# Patient Record
Sex: Female | Born: 1985
Health system: Southern US, Community
[De-identification: ages and names within clinical notes are randomized; demographics above are authoritative.]

## PROBLEM LIST (undated history)

## (undated) DIAGNOSIS — D649 Anemia, unspecified: Secondary | ICD-10-CM

## (undated) DIAGNOSIS — Z789 Other specified health status: Secondary | ICD-10-CM

## (undated) HISTORY — DX: Anemia, unspecified: D64.9

## (undated) HISTORY — PX: NO PAST SURGERIES: SHX2092

## (undated) HISTORY — DX: Other specified health status: Z78.9

---

## 2011-08-29 DIAGNOSIS — L700 Acne vulgaris: Secondary | ICD-10-CM | POA: Insufficient documentation

## 2011-08-29 HISTORY — DX: Acne vulgaris: L70.0

## 2015-12-29 DIAGNOSIS — R630 Anorexia: Secondary | ICD-10-CM | POA: Insufficient documentation

## 2017-12-18 NOTE — Progress Notes (Addendum)
[facility] [address], [address] [place], [place] [address] [phone]  Date:  12/20/2017   Name:  Tammy Knapp   DOB:  October 07, 1985   MRN:  [id number]  PCP:  Pearline Cables, MD    Chief Complaint: Establish Care (Pt here to est care. )   History of Present Illness:  Tammy Knapp is a 32 y.o. very pleasant female patient who presents with the following:  Here today as a new patient to establish care Reviewed available records in EPIC Her husband is a [employer] employee in the IT dept so she has come to see [place] She had been with a [place] practice but has transferred to [place] her due to insurance change as above  She is married, she is from [place]. She has been in the [place] for 13 years She has a 49 yo daughter and stays home with her right now She graduated from [employer]; her degree is in biology In her free time she enjoys TV  She denies any major health issues in the past No operations NKDA  She does not take any medications  Trying to conceiveagain for a couple of months so far.   Her menses are regular   She had meant today to be a CPE and pap- can do for her today She notes that her last pap was about 3 years ago and that she has never had an abno  There are no active problems to display for this patient.   Past Medical History:  Diagnosis Date  . No pertinent past medical history     Past Surgical History:  Procedure Laterality Date  . NO PAST SURGERIES      Social History   Tobacco Use  . Smoking status: Not on file  Substance Use Topics  . Alcohol use: Not on file  . Drug use: Not on file    Family History  Problem Relation Age of Onset  . Healthy Mother   . Healthy Father     Allergies not on file  Medication list has been reviewed and updated.  Current Outpatient Medications on File Prior to Visit  Medication Sig Dispense Refill  . Prenatal Vit-Fe Fumarate-FA (PRENATAL PO) Take 1 tablet by mouth  as needed.     No current facility-administered medications on file prior to visit.     Review of Systems:  As per HPI- otherwise negative. No fever or chills No CP or SOB Her only concern is bad breath- she does visit DDS on a regular basis but is not flossing. Encouraged her to floss as well  Physical Examination: Vitals:   12/20/17 1005  BP: 110/74  Pulse: 86  Temp: 98 F (36.7 C)  SpO2: 96%   Vitals:   12/20/17 1005  Weight: 122 lb 12.8 oz (55.7 kg)  Height:  (1.626 m)   Body mass index is 21.08 kg/m. Ideal Body Weight: Weight in (lb) to have BMI = 25: 145.3  GEN: WDWN, NAD, Non-toxic, A & O x 3, petite build, looks well  HEENT: Atraumatic, Normocephalic. Neck supple. No masses, No LAD.  Bilateral TM wnl, oropharynx normal.  PEERL,EOMI.   Ears and Nose: No external deformity. CV: RRR, No M/G/R. No JVD. No thrill. No extra heart sounds. PULM: CTA B, no wheezes, crackles, rhonchi. No retractions. No resp. distress. No accessory muscle use. ABD: S, NT, ND, +BS. No rebound. No HSM. EXTR: No c/c/e NEURO  Normal gait.  PSYCH: Normally interactive. Conversant. Not depressed or anxious appearing.  Calm demeanor.  Pelvic: normal, no vaginal lesions or discharge. Uterus normal, no CMT, no adnexal tendereness or masses    Assessment and Plan: Physical exam  Screening for cervical cancer - Plan: Cytology - PAP  Screening for deficiency anemia - Plan: CBC  Screening for diabetes mellitus - Plan: Comprehensive metabolic panel, Hemoglobin A1c  Screening for hyperlipidemia - Plan: Lipid panel  Patient desires pregnancy - Plan: Ambulatory referral to Obstetrics / Gynecology  CPE as a new patient today Will plan further follow- up pending labs. Did go ahead and do OBG referral for her so she can establish care, she hopes to be pregnant soon   Signed Abbe Amsterdam, MD  Received blood work-  Message to pt  Results for orders placed or performed in visit on  12/20/17  CBC  Result Value Ref Range   WBC 5.9 4.0 - 10.5 K/uL   RBC 3.94 3.87 - 5.11 Mil/uL   Platelets 241.0 150.0 - 400.0 K/uL   Hemoglobin 12.5 12.0 - 15.0 g/dL   HCT 16.1 09.6 - 04.5 %   MCV 94.0 78.0 - 100.0 fl   MCHC 33.6 30.0 - 36.0 g/dL   RDW 40.9 81.1 - 91.4 %  Comprehensive metabolic panel  Result Value Ref Range   Sodium 137 135 - 145 mEq/L   Potassium 3.8 3.5 - 5.1 mEq/L   Chloride 105 96 - 112 mEq/L   CO2 26 19 - 32 mEq/L   Glucose, Bld 91 70 - 99 mg/dL   BUN 16 6 - 23 mg/dL   Creatinine, Ser 7.82 0.40 - 1.20 mg/dL   Total Bilirubin 0.4 0.2 - 1.2 mg/dL   Alkaline Phosphatase 51 39 - 117 U/L   AST 12 0 - 37 U/L   ALT 8 0 - 35 U/L   Total Protein 7.6 6.0 - 8.3 g/dL   Albumin 4.3 3.5 - 5.2 g/dL   Calcium 9.5 8.4 - 95.6 mg/dL   GFR 213.08 >65.78 mL/min  Hemoglobin A1c  Result Value Ref Range   Hgb A1c MFr Bld 5.2 4.6 - 6.5 %  Lipid panel  Result Value Ref Range   Cholesterol 163 0 - 200 mg/dL   Triglycerides 46.9 0.0 - 149.0 mg/dL   HDL 62.95 >28.41 mg/dL   VLDL 32.4 0.0 - 40.1 mg/dL   LDL Cholesterol 98 0 - 99 mg/dL   Total CHOL/HDL Ratio 3    NonHDL 115.47   Cytology - PAP  Result Value Ref Range   Adequacy      Satisfactory for evaluation  endocervical/transformation zone component PRESENT.   Diagnosis      NEGATIVE FOR INTRAEPITHELIAL LESIONS OR MALIGNANCY.   HPV NOT DETECTED    Material Submitted CervicoVaginal Pap [ThinPrep Imaged]    received pap 5/4- message to pt

## 2017-12-20 ENCOUNTER — Ambulatory Visit (INDEPENDENT_AMBULATORY_CARE_PROVIDER_SITE_OTHER): Payer: No Typology Code available for payment source | Admitting: Family Medicine

## 2017-12-20 ENCOUNTER — Other Ambulatory Visit (HOSPITAL_COMMUNITY)
Admission: RE | Admit: 2017-12-20 | Discharge: 2017-12-20 | Disposition: A | Discharge status: Home / Self Care | Payer: No Typology Code available for payment source | Source: Ambulatory Visit | Attending: Family Medicine | Admitting: Family Medicine

## 2017-12-20 ENCOUNTER — Encounter: Payer: Self-pay | Admitting: Family Medicine

## 2017-12-20 VITALS — BP 110/74 | HR 86 | Temp 98.0°F | Ht 64.0 in | Wt 122.8 lb

## 2017-12-20 DIAGNOSIS — Z130 Encounter for screening for diseases of the blood and blood-forming organs and certain disorders involving the immune mechanism: Secondary | ICD-10-CM | POA: Diagnosis not present

## 2017-12-20 DIAGNOSIS — Z124 Encounter for screening for malignant neoplasm of cervix: Secondary | ICD-10-CM | POA: Diagnosis not present

## 2017-12-20 DIAGNOSIS — Z13220 Encounter for screening for lipoid disorders: Secondary | ICD-10-CM | POA: Diagnosis not present

## 2017-12-20 DIAGNOSIS — Z319 Encounter for procreative management, unspecified: Secondary | ICD-10-CM

## 2017-12-20 DIAGNOSIS — Z0000 Encounter for general adult medical examination without abnormal findings: Secondary | ICD-10-CM

## 2017-12-20 DIAGNOSIS — Z1151 Encounter for screening for human papillomavirus (HPV): Secondary | ICD-10-CM | POA: Insufficient documentation

## 2017-12-20 DIAGNOSIS — Z131 Encounter for screening for diabetes mellitus: Secondary | ICD-10-CM

## 2017-12-20 LAB — CBC
HCT: 37.1 % (ref 36.0–46.0)
HEMOGLOBIN: 12.5 g/dL (ref 12.0–15.0)
MCHC: 33.6 g/dL (ref 30.0–36.0)
MCV: 94 fl (ref 78.0–100.0)
PLATELETS: 241 10*3/uL (ref 150.0–400.0)
RBC: 3.94 Mil/uL (ref 3.87–5.11)
RDW: 12.9 % (ref 11.5–15.5)
WBC: 5.9 10*3/uL (ref 4.0–10.5)

## 2017-12-20 LAB — HEMOGLOBIN A1C: Hgb A1c MFr Bld: 5.2 % (ref 4.6–6.5)

## 2017-12-20 LAB — COMPREHENSIVE METABOLIC PANEL
ALT: 8 U/L (ref 0–35)
AST: 12 U/L (ref 0–37)
Albumin: 4.3 g/dL (ref 3.5–5.2)
Alkaline Phosphatase: 51 U/L (ref 39–117)
BILIRUBIN TOTAL: 0.4 mg/dL (ref 0.2–1.2)
BUN: 16 mg/dL (ref 6–23)
CALCIUM: 9.5 mg/dL (ref 8.4–10.5)
CO2: 26 mEq/L (ref 19–32)
CREATININE: 0.67 mg/dL (ref 0.40–1.20)
Chloride: 105 mEq/L (ref 96–112)
GFR: 107.69 mL/min (ref >60.00)
Glucose, Bld: 91 mg/dL (ref 70–99)
Potassium: 3.8 mEq/L (ref 3.5–5.1)
Sodium: 137 mEq/L (ref 135–145)
TOTAL PROTEIN: 7.6 g/dL (ref 6.0–8.3)

## 2017-12-20 LAB — LIPID PANEL
CHOL/HDL RATIO: 3
CHOLESTEROL: 163 mg/dL (ref 0–200)
HDL: 47.6 mg/dL (ref >39.00)
LDL CALC: 98 mg/dL (ref 0–99)
NonHDL: 115.47
TRIGLYCERIDES: 87 mg/dL (ref 0.0–149.0)
VLDL: 17.4 mg/dL (ref 0.0–40.0)

## 2017-12-20 NOTE — Patient Instructions (Signed)
It was very nice to meet you today- take care and I will be in touch with your labs and pap asap We will also get you set up with an OB/GYN doctor who can help you once you become pregnant. Also do take your pre-natal vitamin daily starting now  Health Maintenance, Female Adopting a healthy lifestyle and getting preventive care can go a long way to promote health and wellness. Talk with your health care provider about what schedule of regular examinations is right for you. This is a good chance for you to check in with your provider about disease prevention and staying healthy. In between checkups, there are plenty of things you can do on your own. Experts have done a lot of research about which lifestyle changes and preventive measures are most likely to keep you healthy. Ask your health care provider for more information. Weight and diet Eat a healthy diet  Be sure to include plenty of vegetables, fruits, low-fat dairy products, and lean protein.  Do not eat a lot of foods high in solid fats, added sugars, or salt.  Get regular exercise. This is one of the most important things you can do for your health. ? Most adults should exercise for at least 150 minutes each week. The exercise should increase your heart rate and make you sweat (moderate-intensity exercise). ? Most adults should also do strengthening exercises at least twice a week. This is in addition to the moderate-intensity exercise.  Maintain a healthy weight  Body mass index (BMI) is a measurement that can be used to identify possible weight problems. It estimates body fat based on height and weight. Your health care provider can help determine your BMI and help you achieve or maintain a healthy weight.  For females 59 years of age and older: ? A BMI below 18.5 is considered underweight. ? A BMI of 18.5 to 24.9 is normal. ? A BMI of 25 to 29.9 is considered overweight. ? A BMI of 30 and above is considered obese.  Watch  levels of cholesterol and blood lipids  You should start having your blood tested for lipids and cholesterol at 33 years of age, then have this test every 5 years.  You may need to have your cholesterol levels checked more often if: ? Your lipid or cholesterol levels are high. ? You are older than 33 years of age. ? You are at high risk for heart disease.  Cancer screening Lung Cancer  Lung cancer screening is recommended for adults 66-55 years old who are at high risk for lung cancer because of a history of smoking.  A yearly low-dose CT scan of the lungs is recommended for people who: ? Currently smoke. ? Have quit within the past 15 years. ? Have at least a 30-pack-year history of smoking. A pack year is smoking an average of one pack of cigarettes a day for 1 year.  Yearly screening should continue until it has been 15 years since you quit.  Yearly screening should stop if you develop a health problem that would prevent you from having lung cancer treatment.  Breast Cancer  Practice breast self-awareness. This means understanding how your breasts normally appear and feel.  It also means doing regular breast self-exams. Let your health care provider know about any changes, no matter how small.  If you are in your 20s or 30s, you should have a clinical breast exam (CBE) by a health care provider every 1-3 years as part of  a regular health exam.  If you are 63 or older, have a CBE every year. Also consider having a breast X-ray (mammogram) every year.  If you have a family history of breast cancer, talk to your health care provider about genetic screening.  If you are at high risk for breast cancer, talk to your health care provider about having an MRI and a mammogram every year.  Breast cancer gene (BRCA) assessment is recommended for women who have family members with BRCA-related cancers. BRCA-related cancers include: ? Breast. ? Ovarian. ? Tubal. ? Peritoneal  cancers.  Results of the assessment will determine the need for genetic counseling and BRCA1 and BRCA2 testing.  Cervical Cancer Your health care provider may recommend that you be screened regularly for cancer of the pelvic organs (ovaries, uterus, and vagina). This screening involves a pelvic examination, including checking for microscopic changes to the surface of your cervix (Pap test). You may be encouraged to have this screening done every 3 years, beginning at age 77.  For women ages 78-65, health care providers may recommend pelvic exams and Pap testing every 3 years, or they may recommend the Pap and pelvic exam, combined with testing for human papilloma virus (HPV), every 5 years. Some types of HPV increase your risk of cervical cancer. Testing for HPV may also be done on women of any age with unclear Pap test results.  Other health care providers may not recommend any screening for nonpregnant women who are considered low risk for pelvic cancer and who do not have symptoms. Ask your health care provider if a screening pelvic exam is right for you.  If you have had past treatment for cervical cancer or a condition that could lead to cancer, you need Pap tests and screening for cancer for at least 20 years after your treatment. If Pap tests have been discontinued, your risk factors (such as having a new sexual partner) need to be reassessed to determine if screening should resume. Some women have medical problems that increase the chance of getting cervical cancer. In these cases, your health care provider may recommend more frequent screening and Pap tests.  Colorectal Cancer  This type of cancer can be detected and often prevented.  Routine colorectal cancer screening usually begins at 33 years of age and continues through 33 years of age.  Your health care provider may recommend screening at an earlier age if you have risk factors for colon cancer.  Your health care provider may also  recommend using home test kits to check for hidden blood in the stool.  A small camera at the end of a tube can be used to examine your colon directly (sigmoidoscopy or colonoscopy). This is done to check for the earliest forms of colorectal cancer.  Routine screening usually begins at age 17.  Direct examination of the colon should be repeated every 5-10 years through 33 years of age. However, you may need to be screened more often if early forms of precancerous polyps or small growths are found.  Skin Cancer  Check your skin from head to toe regularly.  Tell your health care provider about any new moles or changes in moles, especially if there is a change in a mole's shape or color.  Also tell your health care provider if you have a mole that is larger than the size of a pencil eraser.  Always use sunscreen. Apply sunscreen liberally and repeatedly throughout the day.  Protect yourself by wearing long sleeves,  pants, a wide-brimmed hat, and sunglasses whenever you are outside.  Heart disease, diabetes, and high blood pressure  High blood pressure causes heart disease and increases the risk of stroke. High blood pressure is more likely to develop in: ? People who have blood pressure in the high end of the normal range (130-139/85-89 mm Hg). ? People who are overweight or obese. ? People who are African American.  If you are 58-19 years of age, have your blood pressure checked every 3-5 years. If you are 41 years of age or older, have your blood pressure checked every year. You should have your blood pressure measured twice-once when you are at a hospital or clinic, and once when you are not at a hospital or clinic. Record the average of the two measurements. To check your blood pressure when you are not at a hospital or clinic, you can use: ? An automated blood pressure machine at a pharmacy. ? A home blood pressure monitor.  If you are between 4 years and 74 years old, ask your  health care provider if you should take aspirin to prevent strokes.  Have regular diabetes screenings. This involves taking a blood sample to check your fasting blood sugar level. ? If you are at a normal weight and have a low risk for diabetes, have this test once every three years after 33 years of age. ? If you are overweight and have a high risk for diabetes, consider being tested at a younger age or more often. Preventing infection Hepatitis B  If you have a higher risk for hepatitis B, you should be screened for this virus. You are considered at high risk for hepatitis B if: ? You were born in a country where hepatitis B is common. Ask your health care provider which countries are considered high risk. ? Your parents were born in a high-risk country, and you have not been immunized against hepatitis B (hepatitis B vaccine). ? You have HIV or AIDS. ? You use needles to inject street drugs. ? You live with someone who has hepatitis B. ? You have had sex with someone who has hepatitis B. ? You get hemodialysis treatment. ? You take certain medicines for conditions, including cancer, organ transplantation, and autoimmune conditions.  Hepatitis C  Blood testing is recommended for: ? Everyone born from [birth year] through 1966. ? Anyone with known risk factors for hepatitis C.  Sexually transmitted infections (STIs)  You should be screened for sexually transmitted infections (STIs) including gonorrhea and chlamydia if: ? You are sexually active and are younger than 33 years of age. ? You are older than 33 years of age and your health care provider tells you that you are at risk for this type of infection. ? Your sexual activity has changed since you were last screened and you are at an increased risk for chlamydia or gonorrhea. Ask your health care provider if you are at risk.  If you do not have HIV, but are at risk, it may be recommended that you take a prescription medicine daily to  prevent HIV infection. This is called pre-exposure prophylaxis (PrEP). You are considered at risk if: ? You are sexually active and do not regularly use condoms or know the HIV status of your partner(s). ? You take drugs by injection. ? You are sexually active with a partner who has HIV.  Talk with your health care provider about whether you are at high risk of being infected with HIV. If you  choose to begin PrEP, you should first be tested for HIV. You should then be tested every 3 months for as long as you are taking PrEP. Pregnancy  If you are premenopausal and you may become pregnant, ask your health care provider about preconception counseling.  If you may become pregnant, take 400 to 800 micrograms (mcg) of folic acid every day.  If you want to prevent pregnancy, talk to your health care provider about birth control (contraception). Osteoporosis and menopause  Osteoporosis is a disease in which the bones lose minerals and strength with aging. This can result in serious bone fractures. Your risk for osteoporosis can be identified using a bone density scan.  If you are 11 years of age or older, or if you are at risk for osteoporosis and fractures, ask your health care provider if you should be screened.  Ask your health care provider whether you should take a calcium or vitamin D supplement to lower your risk for osteoporosis.  Menopause may have certain physical symptoms and risks.  Hormone replacement therapy may reduce some of these symptoms and risks. Talk to your health care provider about whether hormone replacement therapy is right for you. Follow these instructions at home:  Schedule regular health, dental, and eye exams.  Stay current with your immunizations.  Do not use any tobacco products including cigarettes, chewing tobacco, or electronic cigarettes.  If you are pregnant, do not drink alcohol.  If you are breastfeeding, limit how much and how often you drink  alcohol.  Limit alcohol intake to no more than 1 drink per day for nonpregnant women. One drink equals 12 ounces of beer, 5 ounces of wine, or 1 ounces of hard liquor.  Do not use street drugs.  Do not share needles.  Ask your health care provider for help if you need support or information about quitting drugs.  Tell your health care provider if you often feel depressed.  Tell your health care provider if you have ever been abused or do not feel safe at home. This information is not intended to replace advice given to you by your health care provider. Make sure you discuss any questions you have with your health care provider. Document Released: 02/20/2011 Document Revised: 01/13/2016 Document Reviewed: 05/11/2015 Elsevier Interactive Patient Education  [employer].

## 2017-12-21 LAB — CYTOLOGY - PAP
DIAGNOSIS: NEGATIVE
HPV: NOT DETECTED

## 2017-12-22 ENCOUNTER — Encounter: Payer: Self-pay | Admitting: Family Medicine

## 2018-01-15 NOTE — Progress Notes (Signed)
[facility] [address], [address] [place], [place] [address] [phone]  Date:  01/17/2018   Name:  Tammy Knapp   DOB:  1986/06/30   MRN:  [id number]  PCP:  Tammy Cables, MD    Chief Complaint: Nausea (headache, nausea and vomiting in morning, fatigue, one month) and Emesis   History of Present Illness:  Tammy Knapp is a 32 y.o. very pleasant female patient who presents with the following:  Here today with concern of vomiting  I had seen here earlier this month for a CPE: She is married, she is from [place]. She has been in the [place] for 13 years She has a 42 yo daughter and stays home with her right now She graduated from [employer]; her degree is in biology In her free time she enjoys TV  She denies any major health issues in the past No operations NKDA  She does not take any medications  Trying to conceiveagain for a couple of months so far.   Her menses are regular   She had meant today to be a CPE and pap- can do for her today She notes that her last pap was about 3 years ago and that she has never had an abnormal  Pt has noted that she felt fatigued the last month or so, and she has had some headaches as well  Her HA is not severe however Also the last couple of weeks she had had some vomiting and nausea, mostly in the am   Her LMP was 11/30/17 No vaginal bleeding since No belly pain or cramping She has not taken a home OCP as of yet   There are no active problems to display for this patient.   Past Medical History:  Diagnosis Date  . No pertinent past medical history     Past Surgical History:  Procedure Laterality Date  . NO PAST SURGERIES      Social History   Tobacco Use  . Smoking status: Never Smoker  . Smokeless tobacco: Never Used  Substance Use Topics  . Alcohol use: Not on file  . Drug use: Not on file    Family History  Problem Relation Age of Onset  . Healthy Mother   .  Healthy Father     Not on File  Medication list has been reviewed and updated.  Current Outpatient Medications on File Prior to Visit  Medication Sig Dispense Refill  . Prenatal Vit-Fe Fumarate-FA (PRENATAL PO) Take 1 tablet by mouth as needed.     No current facility-administered medications on file prior to visit.     Review of Systems:  As per HPI- otherwise negative. No fever or chills No CP or SOB No belly pain    Physical Examination: Vitals:   01/17/18 1008  BP: 108/68  Pulse: 68  Resp: 16  SpO2: 98%   Vitals:   01/17/18 1008  Weight: 122 lb 9.6 oz (55.6 kg)  Height:  (1.626 m)   Body mass index is 21.04 kg/m. Ideal Body Weight: Weight in (lb) to have BMI = 25: 145.3  GEN: WDWN, NAD, Non-toxic, A & O x 3, looks well, slim build. Here with her adorable 81 yo daughter today HEENT: Atraumatic, Normocephalic. Neck supple. No masses, No LAD. Ears and Nose: No external deformity. CV: RRR, No M/G/R. No JVD. No thrill. No extra heart sounds. PULM: CTA B, no wheezes, crackles, rhonchi. No retractions.  No resp. distress. No accessory muscle use. ABD: S, NT, ND. No rebound. No HSM.  Benign belly  EXTR: No c/c/e NEURO Normal gait.  PSYCH: Normally interactive. Conversant. Not depressed or anxious appearing.  Calm demeanor.   Results for orders placed or performed in visit on 01/17/18  POCT urine pregnancy  Result Value Ref Range   Preg Test, Ur Positive (A) Negative    Assessment and Plan: Less than [redacted] weeks gestation of pregnancy - Plan: Ambulatory referral to Obstetrics / Gynecology  Morning sickness - Plan: POCT urine pregnancy  Early pregnancy Calculated her ECD in January based on LMP  Will set her up with OBG care right away She is asked to continue her pre-natal vitamins Given information about managing nausea and also a sheet regarding OTC meds in pregnancy per her request   Signed Abbe Amsterdam, MD

## 2018-01-17 ENCOUNTER — Encounter: Payer: Self-pay | Admitting: Family Medicine

## 2018-01-17 ENCOUNTER — Ambulatory Visit (INDEPENDENT_AMBULATORY_CARE_PROVIDER_SITE_OTHER): Payer: No Typology Code available for payment source | Admitting: Family Medicine

## 2018-01-17 VITALS — BP 108/68 | HR 68 | Resp 16 | Ht 64.0 in | Wt 122.6 lb

## 2018-01-17 DIAGNOSIS — Z3A01 Less than 8 weeks gestation of pregnancy: Secondary | ICD-10-CM

## 2018-01-17 DIAGNOSIS — O210 Mild hyperemesis gravidarum: Secondary | ICD-10-CM | POA: Diagnosis not present

## 2018-01-17 LAB — POCT URINE PREGNANCY: PREG TEST UR: POSITIVE — AB

## 2018-01-17 NOTE — Patient Instructions (Signed)
You are pregnant- congratulations!   By your last period you are almost 7 weeks today, and your estimated due date is 09/06/2018 I will refer you to OB here at the [facility] to care for you during your pregnancy Please continue your prenatal vitamins If you have any concerning symptoms such as abdominal pain or bleeding, please seek care right away

## 2018-02-12 ENCOUNTER — Ambulatory Visit (INDEPENDENT_AMBULATORY_CARE_PROVIDER_SITE_OTHER): Payer: No Typology Code available for payment source | Admitting: Advanced Practice Midwife

## 2018-02-12 ENCOUNTER — Encounter: Payer: Self-pay | Admitting: Advanced Practice Midwife

## 2018-02-12 ENCOUNTER — Encounter: Payer: No Typology Code available for payment source | Admitting: Advanced Practice Midwife

## 2018-02-12 VITALS — BP 113/75 | HR 85 | Wt 127.0 lb

## 2018-02-12 DIAGNOSIS — Z113 Encounter for screening for infections with a predominantly sexual mode of transmission: Secondary | ICD-10-CM

## 2018-02-12 DIAGNOSIS — Z3481 Encounter for supervision of other normal pregnancy, first trimester: Secondary | ICD-10-CM

## 2018-02-12 DIAGNOSIS — Z3490 Encounter for supervision of normal pregnancy, unspecified, unspecified trimester: Secondary | ICD-10-CM | POA: Insufficient documentation

## 2018-02-12 NOTE — Patient Instructions (Addendum)
 First Trimester of Pregnancy The first trimester of pregnancy is from week 1 until the end of week 13 (months 1 through 3). A week after a sperm fertilizes an egg, the egg will implant on the wall of the uterus. This embryo will begin to develop into a baby. Genes from you and your partner will form the baby. The female genes will determine whether the baby will be a boy or a girl. At 6-8 weeks, the eyes and face will be formed, and the heartbeat can be seen on ultrasound. At the end of 12 weeks, all the baby's organs will be formed. Now that you are pregnant, you will want to do everything you can to have a healthy baby. Two of the most important things are to get good prenatal care and to follow your health care provider's instructions. Prenatal care is all the medical care you receive before the baby's birth. This care will help prevent, find, and treat any problems during the pregnancy and childbirth. Body changes during your first trimester Your body goes through many changes during pregnancy. The changes vary from woman to woman.  You may gain or lose a couple of pounds at first.  You may feel sick to your stomach (nauseous) and you may throw up (vomit). If the vomiting is uncontrollable, call your health care provider.  You may tire easily.  You may develop headaches that can be relieved by medicines. All medicines should be approved by your health care provider.  You may urinate more often. Painful urination may mean you have a bladder infection.  You may develop heartburn as a result of your pregnancy.  You may develop constipation because certain hormones are causing the muscles that push stool through your intestines to slow down.  You may develop hemorrhoids or swollen veins (varicose veins).  Your breasts may begin to grow larger and become tender. Your nipples may stick out more, and the tissue that surrounds them (areola) may become darker.  Your gums may bleed and may be  sensitive to brushing and flossing.  Dark spots or blotches (chloasma, mask of pregnancy) may develop on your face. This will likely fade after the baby is born.  Your menstrual periods will stop.  You may have a loss of appetite.  You may develop cravings for certain kinds of food.  You may have changes in your emotions from day to day, such as being excited to be pregnant or being concerned that something may go wrong with the pregnancy and baby.  You may have more vivid and strange dreams.  You may have changes in your hair. These can include thickening of your hair, rapid growth, and changes in texture. Some women also have hair loss during or after pregnancy, or hair that feels dry or thin. Your hair will most likely return to normal after your baby is born.  What to expect at prenatal visits During a routine prenatal visit:  You will be weighed to make sure you and the baby are growing normally.  Your blood pressure will be taken.  Your abdomen will be measured to track your baby's growth.  The fetal heartbeat will be listened to between weeks 10 and 14 of your pregnancy.  Test results from any previous visits will be discussed.  Your health care provider may ask you:  How you are feeling.  If you are feeling the baby move.  If you have had any abnormal symptoms, such as leaking fluid, bleeding, severe   headaches, or abdominal cramping.  If you are using any tobacco products, including cigarettes, chewing tobacco, and electronic cigarettes.  If you have any questions.  Other tests that may be performed during your first trimester include:  Blood tests to find your blood type and to check for the presence of any previous infections. The tests will also be used to check for low iron levels (anemia) and protein on red blood cells (Rh antibodies). Depending on your risk factors, or if you previously had diabetes during pregnancy, you may have tests to check for high blood  sugar that affects pregnant women (gestational diabetes).  Urine tests to check for infections, diabetes, or protein in the urine.  An ultrasound to confirm the proper growth and development of the baby.  Fetal screens for spinal cord problems (spina bifida) and Down syndrome.  HIV (human immunodeficiency virus) testing. Routine prenatal testing includes screening for HIV, unless you choose not to have this test.  You may need other tests to make sure you and the baby are doing well.  Follow these instructions at home: Medicines  Follow your health care provider's instructions regarding medicine use. Specific medicines may be either safe or unsafe to take during pregnancy.  Take a prenatal vitamin that contains at least 600 micrograms (mcg) of folic acid.  If you develop constipation, try taking a stool softener if your health care provider approves. Eating and drinking  Eat a balanced diet that includes fresh fruits and vegetables, whole grains, good sources of protein such as meat, eggs, or tofu, and low-fat dairy. Your health care provider will help you determine the amount of weight gain that is right for you.  Avoid raw meat and uncooked cheese. These carry germs that can cause birth defects in the baby.  Eating four or five small meals rather than three large meals a day may help relieve nausea and vomiting. If you start to feel nauseous, eating a few soda crackers can be helpful. Drinking liquids between meals, instead of during meals, also seems to help ease nausea and vomiting.  Limit foods that are high in fat and processed sugars, such as fried and sweet foods.  To prevent constipation: ? Eat foods that are high in fiber, such as fresh fruits and vegetables, whole grains, and beans. ? Drink enough fluid to keep your urine clear or pale yellow. Activity  Exercise only as directed by your health care provider. Most women can continue their usual exercise routine during  pregnancy. Try to exercise for 30 minutes at least 5 days a week. Exercising will help you: ? Control your weight. ? Stay in shape. ? Be prepared for labor and delivery.  Experiencing pain or cramping in the lower abdomen or lower back is a good sign that you should stop exercising. Check with your health care provider before continuing with normal exercises.  Try to avoid standing for long periods of time. Move your legs often if you must stand in one place for a long time.  Avoid heavy lifting.  Wear low-heeled shoes and practice good posture.  You may continue to have sex unless your health care provider tells you not to. Relieving pain and discomfort  Wear a good support bra to relieve breast tenderness.  Take warm sitz baths to soothe any pain or discomfort caused by hemorrhoids. Use hemorrhoid cream if your health care provider approves.  Rest with your legs elevated if you have leg cramps or low back pain.  If you   develop varicose veins in your legs, wear support hose. Elevate your feet for 15 minutes, 3-4 times a day. Limit salt in your diet. Prenatal care  Schedule your prenatal visits by the twelfth week of pregnancy. They are usually scheduled monthly at first, then more often in the last 2 months before delivery.  Write down your questions. Take them to your prenatal visits.  Keep all your prenatal visits as told by your health care provider. This is important. Safety  Wear your seat belt at all times when driving.  Make a list of emergency phone numbers, including numbers for family, friends, the hospital, and police and fire departments. General instructions  Ask your health care provider for a referral to a local prenatal education class. Begin classes no later than the beginning of month 6 of your pregnancy.  Ask for help if you have counseling or nutritional needs during pregnancy. Your health care provider can offer advice or refer you to specialists for help  with various needs.  Do not use hot tubs, steam rooms, or saunas.  Do not douche or use tampons or scented sanitary pads.  Do not cross your legs for long periods of time.  Avoid cat litter boxes and soil used by cats. These carry germs that can cause birth defects in the baby and possibly loss of the fetus by miscarriage or stillbirth.  Avoid all smoking, herbs, alcohol, and medicines not prescribed by your health care provider. Chemicals in these products affect the formation and growth of the baby.  Do not use any products that contain nicotine or tobacco, such as cigarettes and e-cigarettes. If you need help quitting, ask your health care provider. You may receive counseling support and other resources to help you quit.  Schedule a dentist appointment. At home, brush your teeth with a soft toothbrush and be gentle when you floss. Contact a health care provider if:  You have dizziness.  You have mild pelvic cramps, pelvic pressure, or nagging pain in the abdominal area.  You have persistent nausea, vomiting, or diarrhea.  You have a bad smelling vaginal discharge.  You have pain when you urinate.  You notice increased swelling in your face, hands, legs, or ankles.  You are exposed to fifth disease or chickenpox.  You are exposed to German measles (rubella) and have never had it. Get help right away if:  You have a fever.  You are leaking fluid from your vagina.  You have spotting or bleeding from your vagina.  You have severe abdominal cramping or pain.  You have rapid weight gain or loss.  You vomit blood or material that looks like coffee grounds.  You develop a severe headache.  You have shortness of breath.  You have any kind of trauma, such as from a fall or a car accident. Summary  The first trimester of pregnancy is from week 1 until the end of week 13 (months 1 through 3).  Your body goes through many changes during pregnancy. The changes vary from  woman to woman.  You will have routine prenatal visits. During those visits, your health care provider will examine you, discuss any test results you may have, and talk with you about how you are feeling. This information is not intended to replace advice given to you by your health care provider. Make sure you discuss any questions you have with your health care provider. Document Released: 08/01/2001 Document Revised: 07/19/2016 Document Reviewed: 07/19/2016 Elsevier Interactive Patient Education  2018   [employer]   Safe Medications in Pregnancy   Acne: Benzoyl Peroxide Salicylic Acid  Backache/Headache: Tylenol: 2 regular strength every 4 hours OR              2 Extra strength every 6 hours  Colds/Coughs/Allergies: Benadryl (alcohol free) 25 mg every 6 hours as needed Breath right strips Claritin Cepacol throat lozenges Chloraseptic throat spray Cold-Eeze- up to three times per day Cough drops, alcohol free Flonase (by prescription only) Guaifenesin Mucinex Robitussin DM (plain only, alcohol free) Saline nasal spray/drops Sudafed (pseudoephedrine) & Actifed ** use only after [redacted] weeks gestation and if you do not have high blood pressure Tylenol Vicks Vaporub Zinc lozenges Zyrtec   Constipation: Colace Ducolax suppositories Fleet enema Glycerin suppositories Metamucil Milk of magnesia Miralax Senokot Smooth move tea  Diarrhea: Kaopectate Imodium A-D  *NO pepto Bismol  Hemorrhoids: Anusol Anusol HC Preparation H Tucks  Indigestion/Reflux/Heartburn: Tums (Calcium) Maalox Mylanta Zantac  Pepcid (Long acting) 20 mg twice a day  **Pepcid Complete** 2 tablets twice a day  Insomnia: Benadryl (alcohol free) 25mg  every 6 hours as needed Tylenol PM Unisom, no Gelcaps  Leg Cramps: Tums MagGel  Nausea/Vomiting:  Bonine Dramamine Emetrol Ginger extract Sea bands Meclizine  Nausea medication to take during pregnancy:  Unisom (doxylamine  succinate 25 mg tablets) Take one tablet daily at bedtime. If symptoms are not adequately controlled, the dose can be increased to a maximum recommended dose of two tablets daily (1/2 tablet in the morning, 1/2 tablet mid-afternoon and one at bedtime). Vitamin B6 100mg  tablets. Take one tablet twice a day (up to 200 mg per day).  Skin Rashes: Aveeno products Benadryl cream or 25mg  every 6 hours as needed Calamine Lotion 1% cortisone cream  Yeast infection: Gyne-lotrimin 7 Monistat 7   **If taking multiple medications, please check labels to avoid duplicating the same active ingredients **take medication as directed on the label ** Do not exceed 4000 mg of tylenol in 24 hours **Do not take medications that contain aspirin or ibuprofen

## 2018-02-12 NOTE — Progress Notes (Signed)
   Subjective:    Tammy Knapp is a G2P1001 3242w4d being seen today for her first obstetrical visit.  Her obstetrical history is significant for nothing. Patient does not intend to breast feed. Pregnancy history fully reviewed.  Patient reports backache, heartburn, fatigue, HA and nausea. Not able to eat and drink well. Rare vomiting. Hasn't tried anything for Sx.   Vitals:   02/12/18 1006  BP: 113/75  Pulse: 85  Weight: 127 lb (57.6 kg)    HISTORY: OB History  Gravida Para Term Preterm AB Living  2 1 1     1   SAB TAB Ectopic Multiple Live Births          1    # Outcome Date GA Lbr Len/2nd Weight Sex Delivery Anes PTL Lv  2 Current           1 Term 06/10/14 443w0d  7 lb 9 oz (3.43 kg) F Vag-Spont   LIV   Past Medical History:  Diagnosis Date  . No pertinent past medical history    Past Surgical History:  Procedure Laterality Date  . NO PAST SURGERIES     Family History  Problem Relation Age of Onset  . Healthy Mother   . Healthy Father      Exam   + FHR Uterus:     Pelvic Exam: Declined due to recent exam in May   Bony Pelvis: proven to 7-9  System: Breast:  declined   Skin: normal coloration and turgor, no rashes    Neurologic: oriented, grossly non-focal, normal mood   Extremities: gait was normal for age   HEENT sclera clear, anicteric   Mouth/Teeth mucous membranes moist, pharynx normal without lesions and dental hygiene good   Cardiovascular: regular rate   Respiratory:  appears well, vitals normal, no respiratory distress, acyanotic, normal RR   Abdomen: NT   Urinary: deferred      Assessment:    Pregnancy: G2P1001 1. Encounter for supervision of other normal pregnancy in first trimester  - Obstetric Panel, Including HIV - [facility]lam[facility] - Urine Culture - Hemoglobinopathy evaluation\   2. Reflux - Tum, Pepcid PRN  3. Nausea - Declines Tx.  - Dietary measures discussed - Small frequent drinks and snacks.      Plan:     Initial labs drawn. Prenatal vitamins. Problem list reviewed and updated. Genetic Screening discussed: declined.  Ultrasound discussed; fetal survey: declined.  Follow up in 4 weeks. Discussed clinic routines, schedule of care and testing, genetic screening options, involvement of students and residents under the direct supervision of APPs and doctors and presence of female providers. Pt verbalized understanding.   Dorathy KinsmanVirginia Abagael Kramm 02/12/2018

## 2018-02-13 LAB — GC/CHLAMYDIA PROBE AMP (~~LOC~~) NOT AT ARMC
CHLAMYDIA, DNA PROBE: NEGATIVE
Neisseria Gonorrhea: NEGATIVE

## 2018-02-14 LAB — OBSTETRIC PANEL, INCLUDING HIV
ANTIBODY SCREEN: NEGATIVE
BASOS: 0 %
Basophils Absolute: 0 10*3/uL (ref 0.0–0.2)
EOS (ABSOLUTE): 0.1 10*3/uL (ref 0.0–0.4)
EOS: 1 %
HEMATOCRIT: 35.7 % (ref 34.0–46.6)
HEMOGLOBIN: 11.6 g/dL (ref 11.1–15.9)
HIV Screen 4th Generation wRfx: NONREACTIVE
Hepatitis B Surface Ag: NEGATIVE
Immature Grans (Abs): 0 10*3/uL (ref 0.0–0.1)
Immature Granulocytes: 0 %
LYMPHS ABS: 2 10*3/uL (ref 0.7–3.1)
Lymphs: 24 %
MCH: 30.9 pg (ref 26.6–33.0)
MCHC: 32.5 g/dL (ref 31.5–35.7)
MCV: 95 fL (ref 79–97)
MONOS ABS: 0.6 10*3/uL (ref 0.1–0.9)
Monocytes: 7 %
NEUTROS PCT: 68 %
Neutrophils Absolute: 5.8 10*3/uL (ref 1.4–7.0)
Platelets: 277 10*3/uL (ref 150–450)
RBC: 3.75 x10E6/uL — AB (ref 3.77–5.28)
RDW: 13.4 % (ref 12.3–15.4)
RH TYPE: POSITIVE
RPR Ser Ql: NONREACTIVE
Rubella Antibodies, IGG: 10.4 index (ref >0.99)
WBC: 8.5 10*3/uL (ref 3.4–10.8)

## 2018-02-14 LAB — HEMOGLOBINOPATHY EVALUATION
HEMOGLOBIN F QUANTITATION: 0 % (ref 0.0–2.0)
HGB C: 0 %
HGB S: 0 %
HGB VARIANT: 0 %
Hemoglobin A2 Quantitation: 2.5 % (ref 1.8–3.2)
Hgb A: 97.5 % (ref 96.4–98.8)

## 2018-02-14 LAB — URINE CULTURE

## 2018-02-26 ENCOUNTER — Encounter: Payer: No Typology Code available for payment source | Admitting: Advanced Practice Midwife

## 2018-03-11 ENCOUNTER — Ambulatory Visit (INDEPENDENT_AMBULATORY_CARE_PROVIDER_SITE_OTHER): Payer: No Typology Code available for payment source | Admitting: Family Medicine

## 2018-03-11 VITALS — BP 98/67 | HR 85 | Wt 130.1 lb

## 2018-03-11 DIAGNOSIS — Z3481 Encounter for supervision of other normal pregnancy, first trimester: Secondary | ICD-10-CM

## 2018-03-11 NOTE — Progress Notes (Signed)
   PRENATAL VISIT NOTE  Subjective:  Naira Karilyn CotaRehman is a 32 y.o. G2P1001 at 6244w3d being seen today for ongoing prenatal care.  She is currently monitored for the following issues for this low-risk pregnancy and has Supervision of normal pregnancy on their problem list.  Patient reports no complaints.   . Vag. Bleeding: None.  Movement: Absent. Denies leaking of fluid.   The following portions of the patient's history were reviewed and updated as appropriate: allergies, current medications, past family history, past medical history, past social history, past surgical history and problem list. Problem list updated.  Objective:   Vitals:   03/11/18 1009  BP: 98/67  Pulse: 85  Weight: 130 lb 1.3 oz (59 kg)    Fetal Status: Fetal Heart Rate (bpm): 153   Movement: Absent     General:  Alert, oriented and cooperative. Patient is in no acute distress.  Skin: Skin is warm and dry. No rash noted.   Cardiovascular: Normal heart rate noted  Respiratory: Normal respiratory effort, no problems with respiration noted  Abdomen: Soft, gravid, appropriate for gestational age.  Pain/Pressure: Absent     Pelvic: Cervical exam deferred        Extremities: Normal range of motion.  Edema: None  Mental Status: Normal mood and affect. Normal behavior. Normal judgment and thought content.   Assessment and Plan:  Pregnancy: G2P1001 at 1844w3d  1. Encounter for supervision of other normal pregnancy in first trimester FHT and FH normal - US MFM OB COMP + 14 WK; Future  Preterm labor symptoms and general obstetric precautions including but not limited to vaginal bleeding, contractions, leaking of fluid and fetal movement were reviewed in detail with the patient. Please refer to After Visit Summary for other counseling recommendations.  Return in about 1 month (around 04/08/2018) for OB f/u.  No future appointments.  Levie HeritageJacob J Stinson, DO

## 2018-03-20 ENCOUNTER — Encounter: Payer: Self-pay | Admitting: Family Medicine

## 2018-04-08 ENCOUNTER — Ambulatory Visit (INDEPENDENT_AMBULATORY_CARE_PROVIDER_SITE_OTHER): Payer: No Typology Code available for payment source | Admitting: Obstetrics & Gynecology

## 2018-04-08 DIAGNOSIS — Z3482 Encounter for supervision of other normal pregnancy, second trimester: Secondary | ICD-10-CM

## 2018-04-08 NOTE — Patient Instructions (Signed)
Second Trimester of Pregnancy The second trimester is from week 13 through week 28, month 4 through 6. This is often the time in pregnancy that you feel your best. Often times, morning sickness has lessened or quit. You may have more energy, and you may get hungry more often. Your unborn baby (fetus) is growing rapidly. At the end of the sixth month, he or she is about 9 inches long and weighs about 1 pounds. You will likely feel the baby move (quickening) between 18 and 20 weeks of pregnancy. Follow these instructions at home:  Avoid all smoking, herbs, and alcohol. Avoid drugs not approved by your doctor.  Do not use any tobacco products, including cigarettes, chewing tobacco, and electronic cigarettes. If you need help quitting, ask your doctor. You may get counseling or other support to help you quit.  Only take medicine as told by your doctor. Some medicines are safe and some are not during pregnancy.  Exercise only as told by your doctor. Stop exercising if you start having cramps.  Eat regular, healthy meals.  Wear a good support bra if your breasts are tender.  Do not use hot tubs, steam rooms, or saunas.  Wear your seat belt when driving.  Avoid raw meat, uncooked cheese, and liter boxes and soil used by cats.  Take your prenatal vitamins.  Take 1500-2000 milligrams of calcium daily starting at the 20th week of pregnancy until you deliver your baby.  Try taking medicine that helps you poop (stool softener) as needed, and if your doctor approves. Eat more fiber by eating fresh fruit, vegetables, and whole grains. Drink enough fluids to keep your pee (urine) clear or pale yellow.  Take warm water baths (sitz baths) to soothe pain or discomfort caused by hemorrhoids. Use hemorrhoid cream if your doctor approves.  If you have puffy, bulging veins (varicose veins), wear support hose. Raise (elevate) your feet for 15 minutes, 3-4 times a day. Limit salt in your diet.  Avoid heavy  lifting, wear low heals, and sit up straight.  Rest with your legs raised if you have leg cramps or low back pain.  Visit your dentist if you have not gone during your pregnancy. Use a soft toothbrush to brush your teeth. Be gentle when you floss.  You can have sex (intercourse) unless your doctor tells you not to.  Go to your doctor visits. Get help if:  You feel dizzy.  You have mild cramps or pressure in your lower belly (abdomen).  You have a nagging pain in your belly area.  You continue to feel sick to your stomach (nauseous), throw up (vomit), or have watery poop (diarrhea).  You have bad smelling fluid coming from your vagina.  You have pain with peeing (urination). Get help right away if:  You have a fever.  You are leaking fluid from your vagina.  You have spotting or bleeding from your vagina.  You have severe belly cramping or pain.  You lose or gain weight rapidly.  You have trouble catching your breath and have chest pain.  You notice sudden or extreme puffiness (swelling) of your face, hands, ankles, feet, or legs.  You have not felt the baby move in over an hour.  You have severe headaches that do not go away with medicine.  You have vision changes. This information is not intended to replace advice given to you by your health care provider. Make sure you discuss any questions you have with your health care   provider. Document Released: 11/01/2009 Document Revised: 01/13/2016 Document Reviewed: 10/08/2012 Elsevier Interactive Patient Education  2017 [employer] Inc.  

## 2018-04-08 NOTE — Progress Notes (Signed)
   PRENATAL VISIT NOTE  Subjective:  Tammy Knapp is a 32 y.o. G2P1001 at 3140w3d being seen today for ongoing prenatal care.  She is currently monitored for the following issues for this low-risk pregnancy and has Supervision of normal pregnancy on their problem list.  Patient reports no complaints.   . Vag. Bleeding: None.  Movement: Present. Denies leaking of fluid.   The following portions of the patient's history were reviewed and updated as appropriate: allergies, current medications, past family history, past medical history, past social history, past surgical history and problem list. Problem list updated.  Objective:   Vitals:   04/08/18 1015  BP: 98/61  Pulse: 88  Weight: 136 lb 0.6 oz (61.7 kg)    Fetal Status: Fetal Heart Rate (bpm): 150   Movement: Present     General:  Alert, oriented and cooperative. Patient is in no acute distress.  Skin: Skin is warm and dry. No rash noted.   Cardiovascular: Normal heart rate noted  Respiratory: Normal respiratory effort, no problems with respiration noted  Abdomen: Soft, gravid, appropriate for gestational age.  Pain/Pressure: Absent     Pelvic: Cervical exam deferred        Extremities: Normal range of motion.  Edema: None  Mental Status: Normal mood and affect. Normal behavior. Normal judgment and thought content.   Assessment and Plan:  Pregnancy: G2P1001 at 2440w3d  1. Encounter for supervision of other normal pregnancy in second trimester  Preterm labor symptoms and general obstetric precautions including but not limited to vaginal bleeding, contractions, leaking of fluid and fetal movement were reviewed in detail with the patient. Please refer to After Visit Summary for other counseling recommendations.  Return in about 4 weeks (around 05/06/2018).  Future Appointments  Date Time Provider Department Center  04/15/2018 10:30 AM [facility] US 1 [facility]    Willodean Rosenthalarolyn Harraway-Smith, MD

## 2018-04-15 ENCOUNTER — Ambulatory Visit (HOSPITAL_COMMUNITY)
Admission: RE | Admit: 2018-04-15 | Discharge: 2018-04-15 | Disposition: A | Discharge status: Home / Self Care | Payer: No Typology Code available for payment source | Source: Ambulatory Visit | Attending: Family Medicine | Admitting: Family Medicine

## 2018-04-15 ENCOUNTER — Other Ambulatory Visit: Payer: Self-pay | Admitting: Family Medicine

## 2018-04-15 ENCOUNTER — Other Ambulatory Visit (HOSPITAL_COMMUNITY): Payer: Self-pay | Admitting: *Deleted

## 2018-04-15 DIAGNOSIS — Z362 Encounter for other antenatal screening follow-up: Secondary | ICD-10-CM

## 2018-04-15 DIAGNOSIS — Z363 Encounter for antenatal screening for malformations: Secondary | ICD-10-CM | POA: Insufficient documentation

## 2018-04-15 DIAGNOSIS — O359XX0 Maternal care for (suspected) fetal abnormality and damage, unspecified, not applicable or unspecified: Secondary | ICD-10-CM

## 2018-04-15 DIAGNOSIS — Z3A17 17 weeks gestation of pregnancy: Secondary | ICD-10-CM | POA: Diagnosis not present

## 2018-04-15 DIAGNOSIS — Z3481 Encounter for supervision of other normal pregnancy, first trimester: Secondary | ICD-10-CM

## 2018-04-15 DIAGNOSIS — O358XX0 Maternal care for other (suspected) fetal abnormality and damage, not applicable or unspecified: Secondary | ICD-10-CM | POA: Diagnosis not present

## 2018-04-15 DIAGNOSIS — O283 Abnormal ultrasonic finding on antenatal screening of mother: Secondary | ICD-10-CM | POA: Insufficient documentation

## 2018-05-06 ENCOUNTER — Ambulatory Visit (INDEPENDENT_AMBULATORY_CARE_PROVIDER_SITE_OTHER): Payer: No Typology Code available for payment source | Admitting: Obstetrics & Gynecology

## 2018-05-06 VITALS — BP 112/66 | HR 94 | Wt 144.0 lb

## 2018-05-06 DIAGNOSIS — O26849 Uterine size-date discrepancy, unspecified trimester: Secondary | ICD-10-CM | POA: Insufficient documentation

## 2018-05-06 DIAGNOSIS — O3503X0 Maternal care for (suspected) central nervous system malformation or damage in fetus, choroid plexus cysts, not applicable or unspecified: Secondary | ICD-10-CM

## 2018-05-06 DIAGNOSIS — O350XX0 Maternal care for (suspected) central nervous system malformation in fetus, not applicable or unspecified: Secondary | ICD-10-CM

## 2018-05-06 DIAGNOSIS — Z3480 Encounter for supervision of other normal pregnancy, unspecified trimester: Secondary | ICD-10-CM

## 2018-05-06 DIAGNOSIS — O358XX0 Maternal care for other (suspected) fetal abnormality and damage, not applicable or unspecified: Secondary | ICD-10-CM

## 2018-05-06 DIAGNOSIS — O359XX0 Maternal care for (suspected) fetal abnormality and damage, unspecified, not applicable or unspecified: Secondary | ICD-10-CM | POA: Insufficient documentation

## 2018-05-06 NOTE — Progress Notes (Signed)
   PRENATAL VISIT NOTE  Subjective:  Tammy Knapp is a 32 y.o. G2P1001 at 5397w3d being seen today for ongoing prenatal care.  She is currently monitored for the following issues for this high-risk pregnancy and has Supervision of normal pregnancy; Fetal abnormality affecting management of mother, antepartum; and Size of fetus inconsistent with dates, antepartum on their problem list.  Patient reports no complaints.  Contractions: Not present. Vag. Bleeding: None.  Movement: Present. Denies leaking of fluid. Her nausea has resolved.   The following portions of the patient's history were reviewed and updated as appropriate: allergies, current medications, past family history, past medical history, past social history, past surgical history and problem list. Problem list updated.  Objective:   Vitals:   05/06/18 1036  BP: 112/66  Pulse: 94  Weight: 144 lb (65.3 kg)    Fetal Status: Fetal Heart Rate (bpm): 153 Fundal Height: 22 cm Movement: Present     General:  Alert, oriented and cooperative. Patient is in no acute distress.  Skin: Skin is warm and dry. No rash noted.   Cardiovascular: Normal heart rate noted  Respiratory: Normal respiratory effort, no problems with respiration noted  Abdomen: Soft, gravid, appropriate for gestational age.  Pain/Pressure: Absent     Pelvic: Cervical exam performed        Extremities: Normal range of motion.  Edema: None  Mental Status: Normal mood and affect. Normal behavior. Normal judgment and thought content.   Assessment and Plan:  Pregnancy: G2P1001 at 5297w3d  1. Supervision of other normal pregnancy, antepartum  - Genetic Screening - AFP, Serum, Open Spina Bifida  2. Choroid plexus cyst of fetus affecting care of mother, antepartum, single or unspecified fetus Also with EIF on US. Pt is s/p consult with Dr. Judeth CornfieldShankar. She agreed to genetic testing today.   3. Size of fetus inconsistent with dates, antepartum Reviewed 1st trimester US. Her  dates are based on a 1st trimester US. At her 2nd trimester US she is lagging ~2 weeks.  Note sen to Dr. Judeth CornfieldShankar confirmed the 1st trimester US which was not available to him.    Preterm labor symptoms and general obstetric precautions including but not limited to vaginal bleeding, contractions, leaking of fluid and fetal movement were reviewed in detail with the patient. Please refer to After Visit Summary for other counseling recommendations.  Return in about 4 weeks (around 06/03/2018).  Future Appointments  Date Time Provider Department Center  05/13/2018  9:45 AM [facility] US 2 [facility]-US  06/03/2018 10:45 AM Willodean RosenthalHarraway-Smith, Denzal Meir, MD [facility] None    Willodean Rosenthalarolyn Harraway-Smith, MD

## 2018-05-06 NOTE — Progress Notes (Signed)
Bedside ultrasound done GSD measures 5.19 cm consistent with [redacted] weeks gestation By LMP patient is . Featal heart rate 168 bpm. Armandina StammerJennifer Demitrus Francisco RN

## 2018-05-08 LAB — AFP, SERUM, OPEN SPINA BIFIDA
AFP MoM: 0.64
AFP Value: 54.5 ng/mL
GEST. AGE ON COLLECTION DATE: 23 wk
Maternal Age At EDD: 33.7 yr
OSBR Risk 1 IN: 10000
TEST RESULTS AFP: NEGATIVE
Weight: 144 [lb_av]

## 2018-05-13 ENCOUNTER — Other Ambulatory Visit (HOSPITAL_COMMUNITY): Payer: Self-pay | Admitting: *Deleted

## 2018-05-13 ENCOUNTER — Telehealth: Payer: Self-pay

## 2018-05-13 ENCOUNTER — Ambulatory Visit (HOSPITAL_COMMUNITY)
Admission: RE | Admit: 2018-05-13 | Discharge: 2018-05-13 | Disposition: A | Discharge status: Home / Self Care | Payer: No Typology Code available for payment source | Source: Ambulatory Visit | Attending: Obstetrics & Gynecology | Admitting: Obstetrics & Gynecology

## 2018-05-13 DIAGNOSIS — O365920 Maternal care for other known or suspected poor fetal growth, second trimester, not applicable or unspecified: Secondary | ICD-10-CM | POA: Diagnosis not present

## 2018-05-13 DIAGNOSIS — O359XX0 Maternal care for (suspected) fetal abnormality and damage, unspecified, not applicable or unspecified: Secondary | ICD-10-CM | POA: Diagnosis not present

## 2018-05-13 DIAGNOSIS — Z3A23 23 weeks gestation of pregnancy: Secondary | ICD-10-CM | POA: Diagnosis not present

## 2018-05-13 DIAGNOSIS — Z362 Encounter for other antenatal screening follow-up: Secondary | ICD-10-CM | POA: Diagnosis not present

## 2018-05-13 DIAGNOSIS — O365990 Maternal care for other known or suspected poor fetal growth, unspecified trimester, not applicable or unspecified: Secondary | ICD-10-CM

## 2018-05-13 NOTE — Telephone Encounter (Signed)
Called pt to give Panorama results. Informed pt that Panorama results are normal. Pt does not want to know the gender of baby. Understanding was voiced.

## 2018-06-03 ENCOUNTER — Encounter: Payer: Self-pay | Admitting: Obstetrics & Gynecology

## 2018-06-03 ENCOUNTER — Other Ambulatory Visit (HOSPITAL_COMMUNITY): Payer: Self-pay | Admitting: Obstetrics and Gynecology

## 2018-06-03 ENCOUNTER — Encounter (HOSPITAL_COMMUNITY): Payer: Self-pay

## 2018-06-03 ENCOUNTER — Ambulatory Visit (INDEPENDENT_AMBULATORY_CARE_PROVIDER_SITE_OTHER): Payer: No Typology Code available for payment source | Admitting: Obstetrics & Gynecology

## 2018-06-03 ENCOUNTER — Ambulatory Visit (HOSPITAL_COMMUNITY)
Admission: RE | Admit: 2018-06-03 | Discharge: 2018-06-03 | Disposition: A | Discharge status: Home / Self Care | Payer: No Typology Code available for payment source | Source: Ambulatory Visit | Attending: Obstetrics & Gynecology | Admitting: Obstetrics & Gynecology

## 2018-06-03 ENCOUNTER — Other Ambulatory Visit (HOSPITAL_COMMUNITY): Payer: Self-pay | Admitting: *Deleted

## 2018-06-03 VITALS — BP 105/71 | HR 90 | Wt 149.1 lb

## 2018-06-03 DIAGNOSIS — O43199 Other malformation of placenta, unspecified trimester: Secondary | ICD-10-CM

## 2018-06-03 DIAGNOSIS — O365990 Maternal care for other known or suspected poor fetal growth, unspecified trimester, not applicable or unspecified: Secondary | ICD-10-CM

## 2018-06-03 DIAGNOSIS — O359XX0 Maternal care for (suspected) fetal abnormality and damage, unspecified, not applicable or unspecified: Secondary | ICD-10-CM

## 2018-06-03 DIAGNOSIS — Z3A24 24 weeks gestation of pregnancy: Secondary | ICD-10-CM | POA: Diagnosis not present

## 2018-06-03 DIAGNOSIS — Z23 Encounter for immunization: Secondary | ICD-10-CM

## 2018-06-03 DIAGNOSIS — O26849 Uterine size-date discrepancy, unspecified trimester: Secondary | ICD-10-CM

## 2018-06-03 DIAGNOSIS — O365920 Maternal care for other known or suspected poor fetal growth, second trimester, not applicable or unspecified: Secondary | ICD-10-CM | POA: Diagnosis not present

## 2018-06-03 DIAGNOSIS — Z3402 Encounter for supervision of normal first pregnancy, second trimester: Secondary | ICD-10-CM

## 2018-06-03 DIAGNOSIS — Z362 Encounter for other antenatal screening follow-up: Secondary | ICD-10-CM | POA: Diagnosis not present

## 2018-06-03 DIAGNOSIS — O358XX0 Maternal care for other (suspected) fetal abnormality and damage, not applicable or unspecified: Secondary | ICD-10-CM | POA: Diagnosis not present

## 2018-06-03 NOTE — Progress Notes (Signed)
   PRENATAL VISIT NOTE  Subjective:  Tammy Knapp is a 32 y.o. G2P1001 at [redacted]w[redacted]d being seen today for ongoing prenatal care.  She is currently monitored for the following issues for this high-risk pregnancy and has Supervision of normal pregnancy; Fetal abnormality affecting management of mother, antepartum; and Size of fetus inconsistent with dates, antepartum on their problem list.  Patient reports no complaints.  Contractions: Not present. Vag. Bleeding: None.  Movement: Present. Denies leaking of fluid.   The following portions of the patient's history were reviewed and updated as appropriate: allergies, current medications, past family history, past medical history, past social history, past surgical history and problem list. Problem list updated.  Objective:   Vitals:   06/03/18 1040  BP: 105/71  Pulse: 90  Weight: 149 lb 1.3 oz (67.6 kg)    Fetal Status: Fetal Heart Rate (bpm): 148   Movement: Present     General:  Alert, oriented and cooperative. Patient is in no acute distress.  Skin: Skin is warm and dry. No rash noted.   Cardiovascular: Normal heart rate noted  Respiratory: Normal respiratory effort, no problems with respiration noted  Abdomen: Soft, gravid, appropriate for gestational age.  Pain/Pressure: Present     Pelvic: Cervical exam deferred        Extremities: Normal range of motion.  Edema: Trace  Mental Status: Normal mood and affect. Normal behavior. Normal judgment and thought content.   Assessment and Plan:  Pregnancy: G2P1001 at [redacted]w[redacted]d  1. Encounter for supervision of normal first pregnancy in second trimester F/u for 28 week labs: 2hour GTT, RPR and HIV  2. Size of fetus inconsistent with dates, antepartum 05/13/2018  Est. FW:     397  gm    0 lb 14 oz      11  % Pt has a repeat US ordered for today  3. Fetal abnormality affecting management of mother, antepartum, single or unspecified fetus NIPTS was WNL  4. Marginal cord insertion   Preterm labor  symptoms and general obstetric precautions including but not limited to vaginal bleeding, contractions, leaking of fluid and fetal movement were reviewed in detail with the patient. Please refer to After Visit Summary for other counseling recommendations.  Return in about 2 weeks (around 06/17/2018).  Future Appointments  Date Time Provider Department Center  06/03/2018  1:15 PM [facility] [place] 4 [facility]-US    Willodean Rosenthal, MD

## 2018-06-04 ENCOUNTER — Telehealth (HOSPITAL_COMMUNITY): Payer: Self-pay | Admitting: Maternal and Fetal Medicine

## 2018-06-04 NOTE — Telephone Encounter (Signed)
Change of EDC to 09/17/2018 resulted in normalization of fetal weight and long bones.

## 2018-06-13 ENCOUNTER — Ambulatory Visit: Payer: No Typology Code available for payment source

## 2018-06-13 DIAGNOSIS — Z3402 Encounter for supervision of normal first pregnancy, second trimester: Secondary | ICD-10-CM

## 2018-06-13 NOTE — Progress Notes (Signed)
Patient sent to lab for glucose testing and 28 week labs. Armandina Stammer RN

## 2018-06-14 LAB — CBC
HEMATOCRIT: 32.1 % — AB (ref 34.0–46.6)
HEMOGLOBIN: 11 g/dL — AB (ref 11.1–15.9)
MCH: 33 pg (ref 26.6–33.0)
MCHC: 34.3 g/dL (ref 31.5–35.7)
MCV: 96 fL (ref 79–97)
Platelets: 248 10*3/uL (ref 150–450)
RBC: 3.33 x10E6/uL — AB (ref 3.77–5.28)
RDW: 12.3 % (ref 12.3–15.4)
WBC: 8.6 10*3/uL (ref 3.4–10.8)

## 2018-06-14 LAB — GLUCOSE TOLERANCE, 2 HOURS W/ 1HR
GLUCOSE, 2 HOUR: 81 mg/dL (ref 65–152)
Glucose, 1 hour: 125 mg/dL (ref 65–179)
Glucose, Fasting: 81 mg/dL (ref 65–91)

## 2018-06-14 LAB — HIV ANTIBODY (ROUTINE TESTING W REFLEX): HIV Screen 4th Generation wRfx: NONREACTIVE

## 2018-06-14 LAB — RPR: RPR: NONREACTIVE

## 2018-06-18 ENCOUNTER — Ambulatory Visit (INDEPENDENT_AMBULATORY_CARE_PROVIDER_SITE_OTHER): Payer: No Typology Code available for payment source | Admitting: Advanced Practice Midwife

## 2018-06-18 ENCOUNTER — Encounter: Payer: Self-pay | Admitting: Advanced Practice Midwife

## 2018-06-18 VITALS — BP 98/61 | HR 91 | Wt 153.1 lb

## 2018-06-18 DIAGNOSIS — O26849 Uterine size-date discrepancy, unspecified trimester: Secondary | ICD-10-CM

## 2018-06-18 DIAGNOSIS — O43199 Other malformation of placenta, unspecified trimester: Secondary | ICD-10-CM

## 2018-06-18 DIAGNOSIS — Z3402 Encounter for supervision of normal first pregnancy, second trimester: Secondary | ICD-10-CM

## 2018-06-18 NOTE — Patient Instructions (Addendum)
AREA PEDIATRIC/FAMILY PRACTICE PHYSICIANS   [facility] [address], [address] [place], [place]  [address] Phone - [phone]   Fax - [phone]  [facility] [address] [place], [place] [address] Phone - [phone]   Fax - [phone]  [facility] [address] [place], [place]  [address] Phone - [phone]   Fax - [phone]  [facility] [address] [place], [place]  [address] Phone - [phone]   Fax - [phone]  [facility] [address] [place], [place]  [address] Phone - [phone]   Fax - [phone]  [facility] [address] [place], [place]  [address] Phone - [phone]   Fax - [phone]  [facility] [address] [place], [place]  [address] Phone - [phone]   Fax - [phone]  [facility] [address] [place], [place]  [address] Phone - [phone]   Fax - [phone]  [facility] [address] [place], [place]  [address] Phone - [phone]   Fax - [phone] [facility] [address] [place], [place]  [address] Phone - [phone]   Fax - [phone]  [facility] [address] [place], [place]  [address] Phone - [phone]   Fax - [phone]  [facility] [address] [place], [place]  [address] Phone - [phone]   Fax - [phone]  [facility] [address], [address] [place], [place]  [address] Phone - [phone]   Fax - [phone]  [facility] [address] [place], [place]  [address] Phone - [phone]  [facility] [address] [place], [place]  [address] Phone - [phone]   Fax - [phone]  [facility] [address] [place], [place]  [address] Phone - [phone]   Fax - [phone]  [facility] [address] [place], [place]  [address] Phone - [phone]   Fax - [phone]  [facility] [address] [place], [place]  [address] Phone - [phone]   Fax - [phone]   [facility] [address] [place], [place]  [address] Phone - [phone]   Fax - [phone]  [facility] [address] [place], [place]  [address] Phone - [phone]   Fax - [phone]  [facility] [address] [place], [place]  [address] Phone - [phone]   Fax - [phone]  [facility] [address] [place], [place]  [address] Phone - [phone]   Fax - [phone]  [facility] [address] , [place]  [address] Phone - [phone]   Fax - [phone] [facility] [address] [place], [place]  [address] Phone - [phone]   Fax - [phone]  [facility] [address] [place], [place]  [address] Phone - [phone]   Fax - [phone]  [facility] [address] [place], [place]  [address] Phone - [phone]   Fax - [phone]  [facility] Charlene Flemming MD [address] [place] [address] Phone [phone]  Fax [phone]   Third Trimester of Pregnancy The third trimester is from week 29 through week 42,   months 7 through 9. This trimester is when your unborn baby (fetus) is growing very fast. At the end of the ninth month, the unborn baby is about 20 inches in length. It weighs about 6-10 pounds. Follow these instructions at home:  Avoid all smoking, herbs, and alcohol. Avoid drugs not approved by your doctor.  Do not use any tobacco products, including cigarettes, chewing tobacco, and electronic  cigarettes. If you need help quitting, ask your doctor. You may get counseling or other support to help you quit.  Only take medicine as told by your doctor. Some medicines are safe and some are not during pregnancy.  Exercise only as told by your doctor. Stop exercising if you start having cramps.  Eat regular, healthy meals.  Wear a good support bra if your breasts are tender.  Do not use hot tubs, steam rooms, or saunas.  Wear your seat belt when driving.  Avoid raw meat, uncooked cheese, and liter boxes and soil used by cats.  Take your prenatal vitamins.  Take 1500-2000 milligrams of calcium daily starting at the 20th week of pregnancy until you deliver your baby.  Try taking medicine that helps you poop (stool softener) as needed, and if your doctor approves. Eat more fiber by eating fresh fruit, vegetables, and whole grains. Drink enough fluids to keep your pee (urine) clear or pale yellow.  Take warm water baths (sitz baths) to soothe pain or discomfort caused by hemorrhoids. Use hemorrhoid cream if your doctor approves.  If you have puffy, bulging veins (varicose veins), wear support hose. Raise (elevate) your feet for 15 minutes, 3-4 times a day. Limit salt in your diet.  Avoid heavy lifting, wear low heels, and sit up straight.  Rest with your legs raised if you have leg cramps or low back pain.  Visit your dentist if you have not gone during your pregnancy. Use a soft toothbrush to brush your teeth. Be gentle when you floss.  You can have sex (intercourse) unless your doctor tells you not to.  Do not travel far distances unless you must. Only do so with your doctor's approval.  Take prenatal classes.  Practice driving to the hospital.  Pack your hospital bag.  Prepare the baby's room.  Go to your doctor visits. Get help if:  You are not sure if you are in labor or if your water has broken.  You are dizzy.  You have mild cramps or pressure in your lower  belly (abdominal).  You have a nagging pain in your belly area.  You continue to feel sick to your stomach (nauseous), throw up (vomit), or have watery poop (diarrhea).  You have bad smelling fluid coming from your vagina.  You have pain with peeing (urination). Get help right away if:  You have a fever.  You are leaking fluid from your vagina.  You are spotting or bleeding from your vagina.  You have severe belly cramping or pain.  You lose or gain weight rapidly.  You have trouble catching your breath and have chest pain.  You notice sudden or extreme puffiness (swelling) of your face, hands, ankles, feet, or legs.  You have not felt the baby move in over an hour.  You have severe headaches that do not go away with medicine.  You have vision changes. This information is not intended to replace advice given to you by your health care provider. Make sure you discuss any questions you have with your health care provider. Document   Released: 11/01/2009 Document Revised: 01/13/2016 Document Reviewed: 10/08/2012 Elsevier Interactive Patient Education  2017 Elsevier Inc.  

## 2018-06-18 NOTE — Progress Notes (Signed)
   PRENATAL VISIT NOTE  Subjective:  Tammy Knapp is a 32 y.o. G2P1001 at [redacted]w[redacted]d being seen today for ongoing prenatal care.  She is currently monitored for the following issues for this low-risk pregnancy and has Supervision of normal pregnancy; Fetal abnormality affecting management of mother, antepartum; Size of fetus inconsistent with dates, antepartum; Marginal insertion of umbilical cord affecting management of mother; Acne vulgaris; and Decreased appetite on their problem list.  Patient reports no complaints.  Contractions: Not present. Vag. Bleeding: None.  Movement: Present. Denies leaking of fluid.   The following portions of the patient's history were reviewed and updated as appropriate: allergies, current medications, past family history, past medical history, past social history, past surgical history and problem list. Problem list updated.  Objective:   Vitals:   06/18/18 1038  BP: 98/61  Pulse: 91  Weight: 69.5 kg    Fetal Status: Fetal Heart Rate (bpm): 146 Fundal Height: 28 cm Movement: Present     General:  Alert, oriented and cooperative. Patient is in no acute distress.  Skin: Skin is warm and dry. No rash noted.   Cardiovascular: Normal heart rate noted  Respiratory: Normal respiratory effort, no problems with respiration noted  Abdomen: Soft, gravid, appropriate for gestational age.  Pain/Pressure: Absent     Pelvic: Cervical exam deferred        Extremities: Normal range of motion.  Edema: None  Mental Status: Normal mood and affect. Normal behavior. Normal judgment and thought content.   Assessment and Plan:  Pregnancy: G2P1001 at [redacted]w[redacted]d  1. Encounter for supervision of normal first pregnancy in second trimester      Reviewed GTT is normal      [place] has been scheduled  2. Size of fetus inconsistent with dates, antepartum 06/03/2018 Est. FW: 643 gm 31% Pt has a repeat US ordered for 06/26/18  3. Fetal abnormality affecting management of mother,  antepartum, single or unspecified fetus NIPTS was WNL  4. Marginal cord insertion   Preterm labor symptoms and general obstetric precautions including but not limited to vaginal bleeding, contractions, leaking of fluid and fetal movement were reviewed in detail with the patient. Please refer to After Visit Summary for other counseling recommendations.  Return in about 2 weeks (around 07/02/2018) for [employer].  Future Appointments  Date Time Provider Department Center  06/26/2018 12:30 PM [facility] [place] 1 [facility]-M[facility]US M[facility]-US  07/01/2018 10:45 AM Adrian [place], Rhona Raider, DO [facility] None    Wynelle Bourgeois, CNM

## 2018-06-19 ENCOUNTER — Ambulatory Visit (HOSPITAL_COMMUNITY): Payer: No Typology Code available for payment source

## 2018-06-26 ENCOUNTER — Ambulatory Visit (HOSPITAL_COMMUNITY)
Admission: RE | Admit: 2018-06-26 | Discharge: 2018-06-26 | Disposition: A | Discharge status: Home / Self Care | Payer: No Typology Code available for payment source | Source: Ambulatory Visit | Attending: Obstetrics & Gynecology | Admitting: Obstetrics & Gynecology

## 2018-06-26 ENCOUNTER — Encounter (HOSPITAL_COMMUNITY): Payer: Self-pay

## 2018-06-26 DIAGNOSIS — O365990 Maternal care for other known or suspected poor fetal growth, unspecified trimester, not applicable or unspecified: Secondary | ICD-10-CM

## 2018-06-26 DIAGNOSIS — Z3A28 28 weeks gestation of pregnancy: Secondary | ICD-10-CM

## 2018-06-26 DIAGNOSIS — O358XX0 Maternal care for other (suspected) fetal abnormality and damage, not applicable or unspecified: Secondary | ICD-10-CM | POA: Insufficient documentation

## 2018-06-27 ENCOUNTER — Other Ambulatory Visit (HOSPITAL_COMMUNITY): Payer: Self-pay | Admitting: *Deleted

## 2018-06-27 DIAGNOSIS — Z362 Encounter for other antenatal screening follow-up: Secondary | ICD-10-CM

## 2018-07-01 ENCOUNTER — Ambulatory Visit (INDEPENDENT_AMBULATORY_CARE_PROVIDER_SITE_OTHER): Payer: No Typology Code available for payment source | Admitting: Family Medicine

## 2018-07-01 VITALS — BP 105/66 | HR 94 | Wt 160.0 lb

## 2018-07-01 DIAGNOSIS — O43199 Other malformation of placenta, unspecified trimester: Secondary | ICD-10-CM

## 2018-07-01 DIAGNOSIS — O43193 Other malformation of placenta, third trimester: Secondary | ICD-10-CM

## 2018-07-01 DIAGNOSIS — Z3403 Encounter for supervision of normal first pregnancy, third trimester: Secondary | ICD-10-CM

## 2018-07-01 DIAGNOSIS — Z3402 Encounter for supervision of normal first pregnancy, second trimester: Secondary | ICD-10-CM

## 2018-07-01 NOTE — Progress Notes (Signed)
   PRENATAL VISIT NOTE  Subjective:  Tammy Knapp is a 32 y.o. G2P1001 at [redacted]w[redacted]d being seen today for ongoing prenatal care.  She is currently monitored for the following issues for this low-risk pregnancy and has Supervision of normal pregnancy; Fetal abnormality affecting management of mother, antepartum; Size of fetus inconsistent with dates, antepartum; Marginal insertion of umbilical cord affecting management of mother; Acne vulgaris; and Decreased appetite on their problem list.  Patient reports no complaints.  Contractions: Not present. Vag. Bleeding: None.  Movement: Present. Denies leaking of fluid.   The following portions of the patient's history were reviewed and updated as appropriate: allergies, current medications, past family history, past medical history, past social history, past surgical history and problem list. Problem list updated.  Objective:   Vitals:   07/01/18 1041  BP: 105/66  Pulse: 94  Weight: 160 lb (72.6 kg)    Fetal Status: Fetal Heart Rate (bpm): 145   Movement: Present     General:  Alert, oriented and cooperative. Patient is in no acute distress.  Skin: Skin is warm and dry. No rash noted.   Cardiovascular: Normal heart rate noted  Respiratory: Normal respiratory effort, no problems with respiration noted  Abdomen: Soft, gravid, appropriate for gestational age.  Pain/Pressure: Present     Pelvic: Cervical exam deferred        Extremities: Normal range of motion.  Edema: None  Mental Status: Normal mood and affect. Normal behavior. Normal judgment and thought content.   Assessment and Plan:  Pregnancy: G2P1001 at [redacted]w[redacted]d  1. Encounter for supervision of normal first pregnancy in second trimester FHT and FH normal  2. Marginal insertion of umbilical cord affecting management of mother Fetal growth normal  Preterm labor symptoms and general obstetric precautions including but not limited to vaginal bleeding, contractions, leaking of fluid and fetal  movement were reviewed in detail with the patient. Please refer to After Visit Summary for other counseling recommendations.  Return in about 2 weeks (around 07/15/2018) for OB f/u.  Future Appointments  Date Time Provider Department Center  08/07/2018  9:45 AM [facility] [place] 5 [facility]-M[facility]US MFC-US    Levie Heritage, DO

## 2018-07-16 ENCOUNTER — Ambulatory Visit (INDEPENDENT_AMBULATORY_CARE_PROVIDER_SITE_OTHER): Payer: No Typology Code available for payment source | Admitting: Advanced Practice Midwife

## 2018-07-16 ENCOUNTER — Encounter: Payer: Self-pay | Admitting: Advanced Practice Midwife

## 2018-07-16 VITALS — BP 106/64 | HR 98 | Wt 164.0 lb

## 2018-07-16 DIAGNOSIS — O26849 Uterine size-date discrepancy, unspecified trimester: Secondary | ICD-10-CM

## 2018-07-16 DIAGNOSIS — Z3A31 31 weeks gestation of pregnancy: Secondary | ICD-10-CM

## 2018-07-16 DIAGNOSIS — Z3402 Encounter for supervision of normal first pregnancy, second trimester: Secondary | ICD-10-CM

## 2018-07-16 DIAGNOSIS — O26843 Uterine size-date discrepancy, third trimester: Secondary | ICD-10-CM

## 2018-07-16 DIAGNOSIS — O359XX0 Maternal care for (suspected) fetal abnormality and damage, unspecified, not applicable or unspecified: Secondary | ICD-10-CM

## 2018-07-16 NOTE — Progress Notes (Signed)
   PRENATAL VISIT NOTE  Subjective:  Tammy Knapp is a 32 y.o. G2P1001 at 2168w0d being seen today for ongoing prenatal care.  She is currently monitored for the following issues for this high-risk pregnancy and has Supervision of normal pregnancy; Fetal abnormality affecting management of mother, antepartum; Size of fetus inconsistent with dates, antepartum; Marginal insertion of umbilical cord affecting management of mother; Acne vulgaris; and Decreased appetite on their problem list.  Patient reports no complaints.  Contractions: Not present. Vag. Bleeding: None.  Movement: Present. Denies leaking of fluid.   The following portions of the patient's history were reviewed and updated as appropriate: allergies, current medications, past family history, past medical history, past social history, past surgical history and problem list. Problem list updated.  Objective:   Vitals:   07/16/18 1016  BP: 106/64  Pulse: 98  Weight: 74.4 kg    Fetal Status: Fetal Heart Rate (bpm): 139 Fundal Height: 31 cm Movement: Present     General:  Alert, oriented and cooperative. Patient is in no acute distress.  Skin: Skin is warm and dry. No rash noted.   Cardiovascular: Normal heart rate noted  Respiratory: Normal respiratory effort, no problems with respiration noted  Abdomen: Soft, gravid, appropriate for gestational age.  Pain/Pressure: Absent     Pelvic: Cervical exam deferred        Extremities: Normal range of motion.  Edema: None  Mental Status: Normal mood and affect. Normal behavior. Normal judgment and thought content.   Assessment and Plan:  Pregnancy: G2P1001 at 7268w0d  1. Encounter for supervision of normal first pregnancy in second trimester     Reviewed normal Glucola     Reviewed use of MAU and where it is located, things to report/be seen for reviewed  2. Size of fetus inconsistent with dates, antepartum      06/26/18  Growth 44%ile  3. Fetal abnormality affecting management of  mother, antepartum, single or unspecified fetus      06/26/18  CPC resolved  Preterm labor symptoms and general obstetric precautions including but not limited to vaginal bleeding, contractions, leaking of fluid and fetal movement were reviewed in detail with the patient. Please refer to After Visit Summary for other counseling recommendations.  [facility] in about 2 weeks (around 07[facility]30/2018[facility]  Future Appointments  Date Time Provider Department Center  07/30/2018 10:00 AM Aviva SignsWilliams, Sumit Branham L, CNM [facility] None  08/07/2018  9:45 AM [facility]-M[facility] US 5 WH-MFCUS MFC-US    Wynelle BourgeoisMarie Aleia Larocca, CNM

## 2018-07-16 NOTE — Patient Instructions (Signed)

## 2018-07-22 ENCOUNTER — Encounter: Payer: No Typology Code available for payment source | Admitting: Obstetrics & Gynecology

## 2018-07-30 ENCOUNTER — Encounter: Payer: Self-pay | Admitting: Advanced Practice Midwife

## 2018-07-30 ENCOUNTER — Ambulatory Visit (INDEPENDENT_AMBULATORY_CARE_PROVIDER_SITE_OTHER): Payer: No Typology Code available for payment source | Admitting: Advanced Practice Midwife

## 2018-07-30 VITALS — BP 109/70 | HR 124 | Wt 167.0 lb

## 2018-07-30 DIAGNOSIS — Z3402 Encounter for supervision of normal first pregnancy, second trimester: Secondary | ICD-10-CM

## 2018-07-30 NOTE — Progress Notes (Signed)
   PRENATAL VISIT NOTE  Subjective:  Tammy Knapp is a 32 y.o. G2P1001 at 8285w0d being seen today for ongoing prenatal care.  She is currently monitored for the following issues for this low-risk pregnancy and has Supervision of normal pregnancy; Fetal abnormality affecting management of mother, antepartum; Size of fetus inconsistent with dates, antepartum; Marginal insertion of umbilical cord affecting management of mother; Acne vulgaris; and Decreased appetite on their problem list.  Patient reports no complaints.  Contractions: Not present. Vag. Bleeding: None.  Movement: Present. Denies leaking of fluid.   The following portions of the patient's history were reviewed and updated as appropriate: allergies, current medications, past family history, past medical history, past social history, past surgical history and problem list. Problem list updated.  Objective:   Vitals:   07/30/18 0949  BP: 109/70  Pulse: (!) 124  Weight: 75.8 kg    Fetal Status: Fetal Heart Rate (bpm): 145 Fundal Height: 33 cm Movement: Present     General:  Alert, oriented and cooperative. Patient is in no acute distress.  Skin: Skin is warm and dry. No rash noted.   Cardiovascular: Normal heart rate noted  Respiratory: Normal respiratory effort, no problems with respiration noted  Abdomen: Soft, gravid, appropriate for gestational age.  Pain/Pressure: Absent     Pelvic: Cervical exam deferred        Extremities: Normal range of motion.  Edema: None  Mental Status: Normal mood and affect. Normal behavior. Normal judgment and thought content.   Assessment and Plan:  Pregnancy: G2P1001 at 4685w0d  1. Encounter for supervision of normal first pregnancy in second trimester     Reviewed signs to report     Discussed sleep issues.  Has lots of aches and pains and cannot sleep. Advised safe to use Tylenol and /or Banadryl     Discussed dry throat.  Does not drink much.  Discussed importance to general health and to  brain health.   Discussed tips for drinking water. Try Lemonade diluted with water.  Drink on schedule "like taking medicine"  Agrees to trying this week to see if it helps her feel better  Preterm labor symptoms and general obstetric precautions including but not limited to vaginal bleeding, contractions, leaking of fluid and fetal movement were reviewed in detail with the patient. Please refer to After Visit Summary for other counseling recommendations.  Return in about 2 weeks (around 08/13/2018) [facility]  Future Appointments  Date Time Provider Department Center  08/07/2018  9:45 AM [facility] US 5 [facility]-MFCUS MFC-US  08/15/2018 10:30 AM Levie HeritageStinson, Jacob J, DO [facility] None    Tammy Knapp, CNM

## 2018-07-30 NOTE — Patient Instructions (Signed)

## 2018-08-01 ENCOUNTER — Ambulatory Visit (INDEPENDENT_AMBULATORY_CARE_PROVIDER_SITE_OTHER): Payer: Self-pay | Admitting: Family Medicine

## 2018-08-01 ENCOUNTER — Encounter: Payer: Self-pay | Admitting: Family Medicine

## 2018-08-01 DIAGNOSIS — J101 Influenza due to other identified influenza virus with other respiratory manifestations: Secondary | ICD-10-CM

## 2018-08-01 DIAGNOSIS — R509 Fever, unspecified: Secondary | ICD-10-CM

## 2018-08-01 LAB — POCT INFLUENZA A/B
Influenza A, POC: NEGATIVE
Influenza B, POC: POSITIVE — AB

## 2018-08-01 NOTE — Patient Instructions (Addendum)
Influenza, Adult  PLAN< Ensure adequate hydration and oral intake of food Call OB/GYN office and let them know of dx and if they want you to come in Use Tylenol for fever 2-3 times daily every 8 hours You have tested positive for the flu, you are contagious until you are fever free for 24 hours without using tylenol or ibuprofen.Please stay out of work until you are no longer contagious. Two major complications after the flu are pneumonia and sinus infections. Please be aware of this and if you are not any better in 7-10 days or you develop worsening cough or sinus pressure, seek care at our clinic or the ED. Continue to wash your hands and wear a mask daily especially around other people.   Influenza, more commonly known as "the flu," is a viral infection that primarily affects the respiratory tract. The respiratory tract includes organs that help you breathe, such as the lungs, nose, and throat. The flu causes many common cold symptoms, as well as a high fever and body aches. The flu spreads easily from person to person (is contagious). Getting a flu shot (influenza vaccination) every year is the best way to prevent influenza. What are the causes? Influenza is caused by a virus. You can catch the virus by:  Breathing in droplets from an infected person's cough or sneeze.  Touching something that was recently contaminated with the virus and then touching your mouth, nose, or eyes.  What increases the risk? The following factors may make you more likely to get the flu:  Not cleaning your hands frequently with soap and water or alcohol-based hand sanitizer.  Having close contact with many people during cold and flu season.  Touching your mouth, eyes, or nose without washing or sanitizing your hands first.  Not drinking enough fluids or not eating a healthy diet.  Not getting enough sleep or exercise.  Being under a high amount of stress.  Not getting a yearly (annual) flu shot.  You  may be at a higher risk of complications from the flu, such as a severe lung infection (pneumonia), if you:  Are over the age of 6.  Are pregnant.  Have a weakened disease-fighting system (immune system). You may have a weakened immune system if you: ? Have HIV or AIDS. ? Are undergoing chemotherapy. ? Aretaking medicines that reduce the activity of (suppress) the immune system.  Have a long-term (chronic) illness, such as heart disease, kidney disease, diabetes, or lung disease.  Have a liver disorder.  Are obese.  Have anemia.  What are the signs or symptoms? Symptoms of this condition typically last 4-10 days and may include:  Fever.  Chills.  Headache, body aches, or muscle aches.  Sore throat.  Cough.  Runny or congested nose.  Chest discomfort and cough.  Poor appetite.  Weakness or tiredness (fatigue).  Dizziness.  Nausea or vomiting.  How is this diagnosed? This condition may be diagnosed based on your medical history and a physical exam. Your health care provider may do a nose or throat swab test to confirm the diagnosis. How is this treated? If influenza is detected early, you can be treated with antiviral medicine that can reduce the length of your illness and the severity of your symptoms. This medicine may be given by mouth (orally) or through an IV tube that is inserted in one of your veins. The goal of treatment is to relieve symptoms by taking care of yourself at home. This may include  taking over-the-counter medicines, drinking plenty of fluids, and adding humidity to the air in your home. In some cases, influenza goes away on its own. Severe influenza or complications from influenza may be treated in a hospital. Follow these instructions at home:  Take over-the-counter and prescription medicines only as told by your health care provider.  Use a cool mist humidifier to add humidity to the air in your home. This can make breathing  easier.  Rest as needed.  Drink enough fluid to keep your urine clear or pale yellow.  Cover your mouth and nose when you cough or sneeze.  Wash your hands with soap and water often, especially after you cough or sneeze. If soap and water are not available, use hand sanitizer.  Stay home from work or school as told by your health care provider. Unless you are visiting your health care provider, try to avoid leaving home until your fever has been gone for 24 hours without the use of medicine.  Keep all follow-up visits as told by your health care provider. This is important. How is this prevented?  Getting an annual flu shot is the best way to avoid getting the flu. You may get the flu shot in late summer, fall, or winter. Ask your health care provider when you should get your flu shot.  Wash your hands often or use hand sanitizer often.  Avoid contact with people who are sick during cold and flu season.  Eat a healthy diet, drink plenty of fluids, get enough sleep, and exercise regularly. Contact a health care provider if:  You develop new symptoms.  You have: ? Chest pain. ? Diarrhea. ? A fever.  Your cough gets worse.  You produce more mucus.  You feel nauseous or you vomit. Get help right away if:  You develop shortness of breath or difficulty breathing.  Your skin or nails turn a bluish color.  You have severe pain or stiffness in your neck.  You develop a sudden headache or sudden pain in your face or ear.  You cannot stop vomiting. This information is not intended to replace advice given to you by your health care provider. Make sure you discuss any questions you have with your health care provider. Document Released: 08/04/2000 Document Revised: 01/13/2016 Document Reviewed: 06/01/2015 Elsevier Interactive Patient Education  2017 [employer]

## 2018-08-01 NOTE — Progress Notes (Signed)
Tammy Knapp is a 32 y.o. female who presents today with concerns of fever for the last 3-4 days. She denies any known sick contacts and report that her spouse and 110 year old daughter are well. She is 8 months pregnant. She has not taken any medication for this condition up to this point.  Review of Systems  Constitutional: Positive for fever and malaise/fatigue. Negative for chills.  HENT: Negative for congestion, ear discharge, ear pain, sinus pain and sore throat.   Eyes: Negative.   Respiratory: Positive for cough. Negative for sputum production and shortness of breath.   Cardiovascular: Negative.  Negative for chest pain.  Gastrointestinal: Negative for abdominal pain, diarrhea, nausea and vomiting.  Genitourinary: Negative for dysuria, frequency, hematuria and urgency.  Musculoskeletal: Negative for myalgias.  Skin: Negative.   Neurological: Negative for headaches.  Endo/Heme/Allergies: Negative.   Psychiatric/Behavioral: Negative.     O: Vitals:   08/01/18 1848  BP: 100/62  Pulse: (!) 113  Temp: 98.2 F (36.8 C)  SpO2: 98%     Physical Exam Vitals signs reviewed.  Constitutional:      General: She is not in acute distress.    Appearance: She is well-developed and normal weight. She is ill-appearing. She is not toxic-appearing or diaphoretic.  HENT:     Head: Normocephalic.     Right Ear: Hearing, tympanic membrane, ear canal and external ear normal.     Left Ear: Hearing, tympanic membrane, ear canal and external ear normal.     Nose: Nose normal.     Mouth/Throat:     Mouth: Mucous membranes are moist.     Pharynx: Uvula midline.  Neck:     Musculoskeletal: Normal range of motion and neck supple.  Cardiovascular:     Rate and Rhythm: Regular rhythm. Tachycardia present.     Pulses: Normal pulses.     Heart sounds: Normal heart sounds.  Pulmonary:     Effort: Pulmonary effort is normal.     Breath sounds: Normal breath sounds.  Abdominal:     General: Bowel  sounds are normal.     Palpations: Abdomen is soft.     Comments: Patient abdomen visibly pregnant  Musculoskeletal: Normal range of motion.  Lymphadenopathy:     Head:     Right side of head: No submental or submandibular adenopathy.     Left side of head: No submental or submandibular adenopathy.     Cervical: No cervical adenopathy.  Neurological:     Mental Status: She is alert and oriented to person, place, and time.    A: 1. Fever, unspecified fever cause   2. Influenza B    P: Discussed exam findings, diagnosis etiology and medication use and indications reviewed with patient. Follow- Up and discharge instructions provided. No emergent/urgent issues found on exam.  Patient verbalized understanding of information provided and agrees with plan of care (POC), all questions answered.Discussed natural history of the disease and treatment options; including supportive care and antiviral therapy- patient is out of the treatment window with 4 days of symptoms. Not high risk for serious influenza complications. Encouraged rest, hydration, and to continue OTC tylenol as needed for fever. Advised discussing diagnosis with OB for additional guidance.  1. Fever, unspecified fever cause - POCT Influenza A/B  2. Influenza B Results for orders placed or performed in visit on 08/01/18 (from the past 24 hour(s))  POCT Influenza A/B     Status: Abnormal   Collection Time: 08/01/18  6:58  PM  Result Value Ref Range   Influenza A, POC Negative Negative   Influenza B, POC Positive (A) Negative   PLAN< Ensure adequate hydration and oral intake of food Call OB/GYN office and let them know of dx and if they want you to come in Use Tylenol for fever 2-3 times daily every 8 hours You have tested positive for the flu, you are contagious until you are fever free for 24 hours without using tylenol or ibuprofen.Please stay out of work until you are no longer contagious. Two major complications after the  flu are pneumonia and sinus infections. Please be aware of this and if you are not any better in 7-10 days or you develop worsening cough or sinus pressure, seek care at our clinic or the ED. Continue to wash your hands and wear a mask daily especially around other people.

## 2018-08-07 ENCOUNTER — Ambulatory Visit (HOSPITAL_COMMUNITY)
Admission: RE | Admit: 2018-08-07 | Discharge: 2018-08-07 | Disposition: A | Discharge status: Home / Self Care | Payer: No Typology Code available for payment source | Source: Ambulatory Visit | Attending: Family Medicine | Admitting: Family Medicine

## 2018-08-13 ENCOUNTER — Ambulatory Visit (HOSPITAL_COMMUNITY)
Admission: RE | Admit: 2018-08-13 | Discharge: 2018-08-13 | Disposition: A | Discharge status: Home / Self Care | Payer: No Typology Code available for payment source | Source: Ambulatory Visit | Attending: Obstetrics and Gynecology | Admitting: Obstetrics and Gynecology

## 2018-08-13 ENCOUNTER — Encounter (HOSPITAL_COMMUNITY): Payer: Self-pay

## 2018-08-13 DIAGNOSIS — O358XX0 Maternal care for other (suspected) fetal abnormality and damage, not applicable or unspecified: Secondary | ICD-10-CM | POA: Diagnosis not present

## 2018-08-13 DIAGNOSIS — Z3A35 35 weeks gestation of pregnancy: Secondary | ICD-10-CM | POA: Diagnosis not present

## 2018-08-13 DIAGNOSIS — O359XX0 Maternal care for (suspected) fetal abnormality and damage, unspecified, not applicable or unspecified: Secondary | ICD-10-CM | POA: Diagnosis not present

## 2018-08-13 DIAGNOSIS — Z362 Encounter for other antenatal screening follow-up: Secondary | ICD-10-CM

## 2018-08-15 ENCOUNTER — Encounter: Payer: Self-pay | Admitting: Family Medicine

## 2018-08-15 ENCOUNTER — Ambulatory Visit (INDEPENDENT_AMBULATORY_CARE_PROVIDER_SITE_OTHER): Payer: No Typology Code available for payment source | Admitting: Family Medicine

## 2018-08-15 VITALS — BP 109/72 | HR 120

## 2018-08-15 DIAGNOSIS — O43199 Other malformation of placenta, unspecified trimester: Secondary | ICD-10-CM

## 2018-08-15 DIAGNOSIS — Z3402 Encounter for supervision of normal first pregnancy, second trimester: Secondary | ICD-10-CM

## 2018-08-15 NOTE — Progress Notes (Signed)
   PRENATAL VISIT NOTE  Subjective:  Tammy Knapp is a 32 y.o. G2P1001 at 2239w2d being seen today for ongoing prenatal care.  She is currently monitored for the following issues for this high-risk pregnancy and has Supervision of normal pregnancy; Fetal abnormality affecting management of mother, antepartum; Size of fetus inconsistent with dates, antepartum; Marginal insertion of umbilical cord affecting management of mother; Acne vulgaris; and Decreased appetite on their problem list.  Patient reports no complaints.  Contractions: Not present.  .  Movement: Present. Denies leaking of fluid.   The following portions of the patient's history were reviewed and updated as appropriate: allergies, current medications, past family history, past medical history, past social history, past surgical history and problem list. Problem list updated.  Objective:   Vitals:   08/15/18 1025  BP: 109/72  Pulse: (!) 120    Fetal Status: Fetal Heart Rate (bpm): 150   Movement: Present     General:  Alert, oriented and cooperative. Patient is in no acute distress.  Skin: Skin is warm and dry. No rash noted.   Cardiovascular: Normal heart rate noted  Respiratory: Normal respiratory effort, no problems with respiration noted  Abdomen: Soft, gravid, appropriate for gestational age.  Pain/Pressure: Absent     Pelvic: Cervical exam deferred        Extremities: Normal range of motion.  Edema: None  Mental Status: Normal mood and affect. Normal behavior. Normal judgment and thought content.   Assessment and Plan:  Pregnancy: G2P1001 at 2339w2d  1. Encounter for supervision of normal first pregnancy in second trimester FHT and FH normal  2. Marginal insertion of umbilical cord affecting management of mother Normal growth  Preterm labor symptoms and general obstetric precautions including but not limited to vaginal bleeding, contractions, leaking of fluid and fetal movement were reviewed in detail with the  patient. Please refer to After Visit Summary for other counseling recommendations.  Return in about 1 week (around 08/22/2018) for OB f/u.  No future appointments.  Levie HeritageJacob J , DO

## 2018-08-21 NOTE — L&D Delivery Note (Addendum)
Delivery Note Pt became complete at 1202 after AROM/Pit, pushed well, and at 12:39 PM a viable female was delivered via Vaginal, Spontaneous (Presentation: LOA).  APGAR: 9, 9; weight: pending. Cord clamped and cut after 1 min by family member; hospital cord blood sample collected.  Placenta status: spont ,intact .  Cord: 3 vessels  Anesthesia:  Epidural Episiotomy: None Lacerations: 2nd degree;Perineal Suture Repair: 3.0 monocryl Est. Blood Loss (mL): 710- mainly from time of delivery and during lac repair (pt not tx for uterine atony)  Mom to postpartum.  Baby to Couplet care / Skin to Skin.  [name] CNM 09/11/2018, 1:33 PM  Please schedule this patient for Postpartum visit in: 4 weeks with the following provider: Any provider For C/S patients schedule nurse incision check in weeks 2 weeks: no Low risk pregnancy complicated by: none Delivery mode:  SVD Anticipated Birth Control:  Condoms PP Procedures needed: none  Schedule Integrated BH visit: no

## 2018-08-22 ENCOUNTER — Ambulatory Visit (INDEPENDENT_AMBULATORY_CARE_PROVIDER_SITE_OTHER): Payer: No Typology Code available for payment source | Admitting: Obstetrics & Gynecology

## 2018-08-22 VITALS — BP 108/64 | HR 95 | Wt 172.0 lb

## 2018-08-22 DIAGNOSIS — O359XX0 Maternal care for (suspected) fetal abnormality and damage, unspecified, not applicable or unspecified: Secondary | ICD-10-CM

## 2018-08-22 DIAGNOSIS — O43199 Other malformation of placenta, unspecified trimester: Secondary | ICD-10-CM

## 2018-08-22 DIAGNOSIS — Z3403 Encounter for supervision of normal first pregnancy, third trimester: Secondary | ICD-10-CM

## 2018-08-22 DIAGNOSIS — Z113 Encounter for screening for infections with a predominantly sexual mode of transmission: Secondary | ICD-10-CM | POA: Diagnosis not present

## 2018-08-22 DIAGNOSIS — O43193 Other malformation of placenta, third trimester: Secondary | ICD-10-CM

## 2018-08-22 LAB — OB RESULTS CONSOLE GC/CHLAMYDIA: Gonorrhea: NEGATIVE

## 2018-08-22 LAB — OB RESULTS CONSOLE GBS: GBS: NEGATIVE

## 2018-08-22 NOTE — Progress Notes (Signed)
   PRENATAL VISIT NOTE  Subjective:  Tammy Knapp is a 33 y.o. G2P1001 at [redacted]w[redacted]d being seen today for ongoing prenatal care.  She is currently monitored for the following issues for this low-risk pregnancy and has Supervision of normal pregnancy; Fetal abnormality affecting management of mother, antepartum; Size of fetus inconsistent with dates, antepartum; Marginal insertion of umbilical cord affecting management of mother; Acne vulgaris; and Decreased appetite on their problem list.  Patient reports no complaints.  Contractions: Not present. Vag. Bleeding: None.  Movement: Present. Denies leaking of fluid.   The following portions of the patient's history were reviewed and updated as appropriate: allergies, current medications, past family history, past medical history, past social history, past surgical history and problem list. Problem list updated.  Objective:   Vitals:   08/22/18 1046  BP: 108/64  Pulse: 95  Weight: 172 lb (78 kg)    Fetal Status:     Movement: Present     General:  Alert, oriented and cooperative. Patient is in no acute distress.  Skin: Skin is warm and dry. No rash noted.   Cardiovascular: Normal heart rate noted  Respiratory: Normal respiratory effort, no problems with respiration noted  Abdomen: Soft, gravid, appropriate for gestational age.  Pain/Pressure: Present     Pelvic: Cervical exam deferred       Patient declines cervical exam  Extremities: Normal range of motion.  Edema: None  Mental Status: Normal mood and affect. Normal behavior. Normal judgment and thought content.   Assessment and Plan:  Pregnancy: G2P1001 at [redacted]w[redacted]d  1. Marginal insertion of umbilical cord affecting management of mother   2. Encounter for supervision of normal first pregnancy in third trimester - cultures today  3. Fetal abnormality affecting management of mother, antepartum, single or unspecified fetus   Term labor symptoms and general obstetric precautions including  but not limited to vaginal bleeding, contractions, leaking of fluid and fetal movement were reviewed in detail with the patient. Please refer to After Visit Summary for other counseling recommendations.  No follow-ups on file.  No future appointments.  Allie Bossier, MD

## 2018-08-23 LAB — GC/CHLAMYDIA PROBE AMP (~~LOC~~) NOT AT ARMC
Chlamydia: NEGATIVE
Neisseria Gonorrhea: NEGATIVE

## 2018-08-26 LAB — CULTURE, BETA STREP (GROUP B ONLY): Strep Gp B Culture: NEGATIVE

## 2018-08-30 ENCOUNTER — Ambulatory Visit (INDEPENDENT_AMBULATORY_CARE_PROVIDER_SITE_OTHER): Payer: No Typology Code available for payment source | Admitting: Obstetrics & Gynecology

## 2018-08-30 VITALS — BP 102/63 | HR 110 | Wt 175.1 lb

## 2018-08-30 DIAGNOSIS — Z3480 Encounter for supervision of other normal pregnancy, unspecified trimester: Secondary | ICD-10-CM

## 2018-08-30 DIAGNOSIS — O26849 Uterine size-date discrepancy, unspecified trimester: Secondary | ICD-10-CM

## 2018-08-30 DIAGNOSIS — O26843 Uterine size-date discrepancy, third trimester: Secondary | ICD-10-CM

## 2018-08-30 DIAGNOSIS — O359XX0 Maternal care for (suspected) fetal abnormality and damage, unspecified, not applicable or unspecified: Secondary | ICD-10-CM

## 2018-08-30 DIAGNOSIS — Z3A37 37 weeks gestation of pregnancy: Secondary | ICD-10-CM

## 2018-08-30 DIAGNOSIS — O43193 Other malformation of placenta, third trimester: Secondary | ICD-10-CM

## 2018-08-30 DIAGNOSIS — O43199 Other malformation of placenta, unspecified trimester: Secondary | ICD-10-CM

## 2018-08-30 DIAGNOSIS — Z3483 Encounter for supervision of other normal pregnancy, third trimester: Secondary | ICD-10-CM

## 2018-08-30 NOTE — Progress Notes (Signed)
   PRENATAL VISIT NOTE  Subjective:  Vernecia Wrice is a 33 y.o. G2P1001 at [redacted]w[redacted]d being seen today for ongoing prenatal care.  She is currently monitored for the following issues for this low-risk pregnancy and has Supervision of normal pregnancy; Fetal abnormality affecting management of mother, antepartum; Size of fetus inconsistent with dates, antepartum; Marginal insertion of umbilical cord affecting management of mother; Acne vulgaris; and Decreased appetite on their problem list.  Patient reports pt c/o fatigue.  Contractions: Not present. Vag. Bleeding: None.  Movement: Present. Denies leaking of fluid.   The following portions of the patient's history were reviewed and updated as appropriate: allergies, current medications, past family history, past medical history, past social history, past surgical history and problem list. Problem list updated.  Objective:   Vitals:   08/30/18 1103  BP: 102/63  Pulse: (!) 110  Weight: 175 lb 1.9 oz (79.4 kg)    Fetal Status: Fetal Heart Rate (bpm): 138   Movement: Present     General:  Alert, oriented and cooperative. Patient is in no acute distress.  Skin: Skin is warm and dry. No rash noted.   Cardiovascular: Normal heart rate noted  Respiratory: Normal respiratory effort, no problems with respiration noted  Abdomen: Soft, gravid, appropriate for gestational age.  Pain/Pressure: Absent     Pelvic: Cervical exam deferred        Extremities: Normal range of motion.  Edema: Trace  Mental Status: Normal mood and affect. Normal behavior. Normal judgment and thought content.   Assessment and Plan:  Pregnancy: G2P1001 at [redacted]w[redacted]d  1. Supervision of other normal pregnancy, antepartum FH now WNL  2. Size of fetus inconsistent with dates, antepartum [place] 12/24 was WNL  3. Marginal insertion of umbilical cord affecting management of mother [place] 12/24 was WNL  4. Fetal abnormality affecting management of mother, antepartum, single or unspecified  fetus [place] 12/24 was WNL  Term labor symptoms and general obstetric precautions including but not limited to vaginal bleeding, contractions, leaking of fluid and fetal movement were reviewed in detail with the patient. Please refer to After Visit Summary for other counseling recommendations.  Return in about 1 week (around 09/06/2018).  No future appointments.  Willodean Rosenthal, MD

## 2018-09-05 ENCOUNTER — Encounter: Payer: Self-pay | Admitting: Family Medicine

## 2018-09-05 ENCOUNTER — Ambulatory Visit (INDEPENDENT_AMBULATORY_CARE_PROVIDER_SITE_OTHER): Payer: No Typology Code available for payment source | Admitting: Family Medicine

## 2018-09-05 VITALS — BP 102/63 | HR 115 | Wt 177.1 lb

## 2018-09-05 DIAGNOSIS — O26849 Uterine size-date discrepancy, unspecified trimester: Secondary | ICD-10-CM

## 2018-09-05 DIAGNOSIS — Z3480 Encounter for supervision of other normal pregnancy, unspecified trimester: Secondary | ICD-10-CM

## 2018-09-05 DIAGNOSIS — O26843 Uterine size-date discrepancy, third trimester: Secondary | ICD-10-CM

## 2018-09-05 DIAGNOSIS — Z3A38 38 weeks gestation of pregnancy: Secondary | ICD-10-CM

## 2018-09-05 DIAGNOSIS — Z3483 Encounter for supervision of other normal pregnancy, third trimester: Secondary | ICD-10-CM

## 2018-09-05 DIAGNOSIS — O43193 Other malformation of placenta, third trimester: Secondary | ICD-10-CM

## 2018-09-05 DIAGNOSIS — O43199 Other malformation of placenta, unspecified trimester: Secondary | ICD-10-CM

## 2018-09-05 NOTE — Progress Notes (Signed)
   PRENATAL VISIT NOTE  Subjective:  Tammy Knapp is a 33 y.o. G2P1001 at 2359w2d being seen today for ongoing prenatal care.  She is currently monitored for the following issues for this low-risk pregnancy and has Supervision of normal pregnancy; Fetal abnormality affecting management of mother, antepartum; Size of fetus inconsistent with dates, antepartum; Marginal insertion of umbilical cord affecting management of mother; Acne vulgaris; and Decreased appetite on their problem list.  Patient reports no complaints.  Contractions: Not present. Vag. Bleeding: None.  Movement: Present. Denies leaking of fluid.   The following portions of the patient's history were reviewed and updated as appropriate: allergies, current medications, past family history, past medical history, past social history, past surgical history and problem list. Problem list updated.  Objective:   Vitals:   09/05/18 1101  BP: 102/63  Pulse: (!) 115  Weight: 177 lb 1.3 oz (80.3 kg)    Fetal Status: Fetal Heart Rate (bpm): 138 Fundal Height: 39 cm Movement: Present  Presentation: Vertex  General:  Alert, oriented and cooperative. Patient is in no acute distress.  Skin: Skin is warm and dry. No rash noted.   Cardiovascular: Normal heart rate noted  Respiratory: Normal respiratory effort, no problems with respiration noted  Abdomen: Soft, gravid, appropriate for gestational age.  Pain/Pressure: Absent     Pelvic: Cervical exam deferred        Extremities: Normal range of motion.  Edema: Trace  Mental Status: Normal mood and affect. Normal behavior. Normal judgment and thought content.   Assessment and Plan:  Pregnancy: G2P1001 at 6759w2d  1. Supervision of other normal pregnancy, antepartum FHT and FH normal  2. Size of fetus inconsistent with dates, antepartum  3. Marginal insertion of umbilical cord affecting management of mother Normal growth on last US  Term labor symptoms and general obstetric precautions  including but not limited to vaginal bleeding, contractions, leaking of fluid and fetal movement were reviewed in detail with the patient. Please refer to After Visit Summary for other counseling recommendations.  No follow-ups on file.  Future Appointments  Date Time Provider Department Center  09/12/2018  9:15 AM Levie HeritageStinson, Jayonna Meyering J, DO [id number] None    Levie HeritageJacob J Kc Summerson, DO

## 2018-09-11 ENCOUNTER — Inpatient Hospital Stay (HOSPITAL_COMMUNITY): Payer: No Typology Code available for payment source | Admitting: Anesthesiology

## 2018-09-11 ENCOUNTER — Inpatient Hospital Stay (HOSPITAL_COMMUNITY)
Admission: AD | Admit: 2018-09-11 | Discharge: 2018-09-12 | DRG: 807 | Disposition: A | Discharge status: Home / Self Care | Payer: No Typology Code available for payment source | Attending: Family Medicine | Admitting: Family Medicine

## 2018-09-11 ENCOUNTER — Encounter (HOSPITAL_COMMUNITY): Payer: Self-pay

## 2018-09-11 ENCOUNTER — Other Ambulatory Visit: Payer: Self-pay

## 2018-09-11 DIAGNOSIS — O43123 Velamentous insertion of umbilical cord, third trimester: Secondary | ICD-10-CM | POA: Diagnosis present

## 2018-09-11 DIAGNOSIS — O43199 Other malformation of placenta, unspecified trimester: Secondary | ICD-10-CM | POA: Diagnosis present

## 2018-09-11 DIAGNOSIS — Z3A39 39 weeks gestation of pregnancy: Secondary | ICD-10-CM

## 2018-09-11 DIAGNOSIS — O26849 Uterine size-date discrepancy, unspecified trimester: Secondary | ICD-10-CM

## 2018-09-11 DIAGNOSIS — Z3490 Encounter for supervision of normal pregnancy, unspecified, unspecified trimester: Secondary | ICD-10-CM

## 2018-09-11 DIAGNOSIS — O359XX0 Maternal care for (suspected) fetal abnormality and damage, unspecified, not applicable or unspecified: Secondary | ICD-10-CM

## 2018-09-11 DIAGNOSIS — Z3483 Encounter for supervision of other normal pregnancy, third trimester: Secondary | ICD-10-CM | POA: Diagnosis present

## 2018-09-11 LAB — ABO/RH: ABO/RH(D): O POS

## 2018-09-11 LAB — TYPE AND SCREEN
ABO/RH(D): O POS
Antibody Screen: NEGATIVE

## 2018-09-11 LAB — RPR: RPR Ser Ql: NONREACTIVE

## 2018-09-11 LAB — CBC
HCT: 37.5 % (ref 36.0–46.0)
Hemoglobin: 12 g/dL (ref 12.0–15.0)
MCH: 31.3 pg (ref 26.0–34.0)
MCHC: 32 g/dL (ref 30.0–36.0)
MCV: 97.9 fL (ref 80.0–100.0)
PLATELETS: 229 10*3/uL (ref 150–400)
RBC: 3.83 MIL/uL — ABNORMAL LOW (ref 3.87–5.11)
RDW: 14.8 % (ref 11.5–15.5)
WBC: 9.8 10*3/uL (ref 4.0–10.5)
nRBC: 0 % (ref 0.0–0.2)

## 2018-09-11 MED ORDER — COCONUT OIL OIL: 1.0000 "application " | TOPICAL_OIL | Status: DC | PRN

## 2018-09-11 MED ORDER — OXYCODONE HCL 5 MG PO TABS: 5.0000 mg | ORAL_TABLET | ORAL | Status: DC | PRN

## 2018-09-11 MED ORDER — DIPHENHYDRAMINE HCL 50 MG/ML IJ SOLN: 12.5000 mg | INTRAMUSCULAR | Status: DC | PRN

## 2018-09-11 MED ORDER — IBUPROFEN 600 MG PO TABS
600.0000 mg | ORAL_TABLET | Freq: Four times a day (QID) | ORAL | Status: DC
  Administered 2018-09-11 – 2018-09-12 (×5): 600 mg via ORAL
  Filled 2018-09-11 (×5): qty 1

## 2018-09-11 MED ORDER — TETANUS-DIPHTH-ACELL PERTUSSIS 5-2.5-18.5 LF-MCG/0.5 IM SUSP: 0.5000 mL | Freq: Once | INTRAMUSCULAR | Status: DC

## 2018-09-11 MED ORDER — SOD CITRATE-CITRIC ACID 500-334 MG/5ML PO SOLN: 30.0000 mL | ORAL | Status: DC | PRN

## 2018-09-11 MED ORDER — BENZOCAINE-MENTHOL 20-0.5 % EX AERO
1.0000 "application " | INHALATION_SPRAY | CUTANEOUS | Status: DC | PRN
  Administered 2018-09-11: 1 via TOPICAL
  Filled 2018-09-11: qty 56

## 2018-09-11 MED ORDER — WITCH HAZEL-GLYCERIN EX PADS: 1.0000 "application " | MEDICATED_PAD | CUTANEOUS | Status: DC | PRN

## 2018-09-11 MED ORDER — OXYCODONE-ACETAMINOPHEN 5-325 MG PO TABS: 2.0000 | ORAL_TABLET | ORAL | Status: DC | PRN

## 2018-09-11 MED ORDER — EPHEDRINE 5 MG/ML INJ
10.0000 mg | INTRAVENOUS | Status: DC | PRN
  Filled 2018-09-11: qty 2

## 2018-09-11 MED ORDER — LACTATED RINGERS IV SOLN
INTRAVENOUS | Status: DC
  Administered 2018-09-11 (×4): via INTRAVENOUS

## 2018-09-11 MED ORDER — DIBUCAINE 1 % RE OINT: 1.0000 "application " | TOPICAL_OINTMENT | RECTAL | Status: DC | PRN

## 2018-09-11 MED ORDER — ACETAMINOPHEN 325 MG PO TABS: 650.0000 mg | ORAL_TABLET | ORAL | Status: DC | PRN

## 2018-09-11 MED ORDER — ACETAMINOPHEN 325 MG PO TABS
650.0000 mg | ORAL_TABLET | ORAL | Status: DC | PRN
  Filled 2018-09-11: qty 2

## 2018-09-11 MED ORDER — OXYTOCIN BOLUS FROM INFUSION
500.0000 mL | Freq: Once | INTRAVENOUS | Status: AC
  Administered 2018-09-11: 500 mL via INTRAVENOUS

## 2018-09-11 MED ORDER — FENTANYL 2.5 MCG/ML BUPIVACAINE 1/10 % EPIDURAL INFUSION (WH - ANES)
14.0000 mL/h | INTRAMUSCULAR | Status: DC | PRN
  Administered 2018-09-11 (×2): 14 mL/h via EPIDURAL
  Filled 2018-09-11 (×2): qty 100

## 2018-09-11 MED ORDER — TERBUTALINE SULFATE 1 MG/ML IJ SOLN
0.2500 mg | Freq: Once | INTRAMUSCULAR | Status: DC | PRN
  Filled 2018-09-11: qty 1

## 2018-09-11 MED ORDER — LIDOCAINE HCL (PF) 1 % IJ SOLN
30.0000 mL | INTRAMUSCULAR | Status: DC | PRN
  Filled 2018-09-11: qty 30

## 2018-09-11 MED ORDER — ONDANSETRON HCL 4 MG/2ML IJ SOLN: 4.0000 mg | Freq: Four times a day (QID) | INTRAMUSCULAR | Status: DC | PRN

## 2018-09-11 MED ORDER — SIMETHICONE 80 MG PO CHEW: 80.0000 mg | CHEWABLE_TABLET | ORAL | Status: DC | PRN

## 2018-09-11 MED ORDER — PHENYLEPHRINE 40 MCG/ML (10ML) SYRINGE FOR IV PUSH (FOR BLOOD PRESSURE SUPPORT)
80.0000 ug | PREFILLED_SYRINGE | INTRAVENOUS | Status: DC | PRN
  Filled 2018-09-11: qty 10

## 2018-09-11 MED ORDER — LIDOCAINE HCL (PF) 1 % IJ SOLN
INTRAMUSCULAR | Status: DC | PRN
  Administered 2018-09-11 (×2): 5 mL via EPIDURAL

## 2018-09-11 MED ORDER — LACTATED RINGERS IV SOLN: 500.0000 mL | INTRAVENOUS | Status: DC | PRN

## 2018-09-11 MED ORDER — PHENYLEPHRINE 40 MCG/ML (10ML) SYRINGE FOR IV PUSH (FOR BLOOD PRESSURE SUPPORT)
80.0000 ug | PREFILLED_SYRINGE | INTRAVENOUS | Status: DC | PRN
  Filled 2018-09-11 (×2): qty 10

## 2018-09-11 MED ORDER — FENTANYL 2.5 MCG/ML BUPIVACAINE 1/10 % EPIDURAL INFUSION (WH - ANES)
INTRAMUSCULAR | Status: AC
  Filled 2018-09-11: qty 100

## 2018-09-11 MED ORDER — OXYTOCIN 40 UNITS IN NORMAL SALINE INFUSION - SIMPLE MED
2.5000 [IU]/h | INTRAVENOUS | Status: DC
  Filled 2018-09-11: qty 1000

## 2018-09-11 MED ORDER — PHENYLEPHRINE 40 MCG/ML (10ML) SYRINGE FOR IV PUSH (FOR BLOOD PRESSURE SUPPORT)
PREFILLED_SYRINGE | INTRAVENOUS | Status: AC
  Filled 2018-09-11: qty 20

## 2018-09-11 MED ORDER — OXYTOCIN 40 UNITS IN NORMAL SALINE INFUSION - SIMPLE MED
1.0000 m[IU]/min | INTRAVENOUS | Status: DC
  Administered 2018-09-11: 2 m[IU]/min via INTRAVENOUS

## 2018-09-11 MED ORDER — LACTATED RINGERS IV SOLN
500.0000 mL | Freq: Once | INTRAVENOUS | Status: AC
  Administered 2018-09-11: 500 mL via INTRAVENOUS

## 2018-09-11 MED ORDER — ONDANSETRON HCL 4 MG PO TABS: 4.0000 mg | ORAL_TABLET | ORAL | Status: DC | PRN

## 2018-09-11 MED ORDER — ONDANSETRON HCL 4 MG/2ML IJ SOLN: 4.0000 mg | INTRAMUSCULAR | Status: DC | PRN

## 2018-09-11 MED ORDER — OXYCODONE-ACETAMINOPHEN 5-325 MG PO TABS: 1.0000 | ORAL_TABLET | ORAL | Status: DC | PRN

## 2018-09-11 MED ORDER — DIPHENHYDRAMINE HCL 25 MG PO CAPS: 25.0000 mg | ORAL_CAPSULE | Freq: Four times a day (QID) | ORAL | Status: DC | PRN

## 2018-09-11 MED ORDER — PRENATAL MULTIVITAMIN CH
1.0000 | ORAL_TABLET | Freq: Every day | ORAL | Status: DC
  Administered 2018-09-12: 1 via ORAL
  Filled 2018-09-11: qty 1

## 2018-09-11 MED ORDER — ZOLPIDEM TARTRATE 5 MG PO TABS: 5.0000 mg | ORAL_TABLET | Freq: Every evening | ORAL | Status: DC | PRN

## 2018-09-11 MED ORDER — SENNOSIDES-DOCUSATE SODIUM 8.6-50 MG PO TABS
2.0000 | ORAL_TABLET | ORAL | Status: DC
  Administered 2018-09-11: 2 via ORAL
  Filled 2018-09-11 (×2): qty 2

## 2018-09-11 NOTE — Discharge Summary (Addendum)
Postpartum Discharge Summary     Patient Name: Tammy Knapp DOB: 1985-12-04 MRN: [id number]  Date of admission: 09/11/2018 Delivering Provider: Dollene Cleveland   Date of discharge: 09/12/2018  Admitting diagnosis: 39WKS CTX Intrauterine pregnancy: [redacted]w[redacted]d     Secondary diagnosis:  Active Problems:   Supervision of normal pregnancy   Marginal insertion of umbilical cord affecting management of mother  Additional problems: none     Discharge diagnosis: Term Pregnancy Delivered                                                                                                Post partum procedures:None  Augmentation: AROM and Pitocin  Complications: None  Hospital course:  Onset of Labor With Vaginal Delivery: 33 y.o. yo G2P1001 at [redacted]w[redacted]d was admitted in Latent Labor on 09/11/2018. Her ctx spaced out after epidural placement, but after AROM and a small amt of Pit she became complete and delivered approx 30 mins later. Patient had an uncomplicated labor course as follows:  Membrane Rupture Time/Date: 11:02 AM ,09/11/2018   Intrapartum Procedures: Episiotomy: None [1]                                         Lacerations:  2nd degree [3];Perineal [11]  Patient had a delivery of a Viable infant. 09/11/2018  Information for the patient's newborn:  Tammy, Loza Girl Knapp [[id number]]  Delivery Method: Vaginal, Spontaneous(Filed from Delivery Summary)   Pateint had an uncomplicated postpartum course.  She is ambulating, tolerating a regular diet, passing flatus, and urinating well. Patient is discharged home in stable condition on 09/12/18.  Magnesium Sulfate recieved: No BMZ received: No  Physical exam  Vitals:   09/11/18 1620 09/11/18 2030 09/12/18 0000 09/12/18 0445  BP: 106/65 107/73 (!) 96/54 90/65  Pulse: 85 89 90 76  Resp: 16 18 16 16   Temp: 98.3 F (36.8 C) (!) 97.5 F (36.4 C) 97.8 F (36.6 C) 97.8 F (36.6 C)  TempSrc: Oral Oral Oral Oral  SpO2:  99% 98% 100%  Weight:       Height:       General: alert, cooperative and no distress Lochia: appropriate Uterine Fundus: firm Incision: 2nd degree laceration healing well, still sensitive to touch and with walking on exam DVT Evaluation: No evidence of DVT seen on physical exam. No significant calf/ankle edema. Labs: Lab Results  Component Value Date   WBC 9.8 09/11/2018   HGB 12.0 09/11/2018   HCT 37.5 09/11/2018   MCV 97.9 09/11/2018   PLT 229 09/11/2018   CMP Latest Ref Rng & Units 12/20/2017  Glucose 70 - 99 mg/dL 91  BUN 6 - 23 mg/dL 16  Creatinine 8.65 - 7.84 mg/dL 6.96  Sodium 295 - 284 mEq/L 137  Potassium 3.5 - 5.1 mEq/L 3.8  Chloride 96 - 112 mEq/L 105  CO2 19 - 32 mEq/L 26  Calcium 8.4 - 10.5 mg/dL 9.5  Total Protein 6.0 - 8.3 g/dL 7.6  Total Bilirubin 0.2 - 1.2  mg/dL 0.4  Alkaline Phos 39 - 117 U/L 51  AST 0 - 37 U/L 12  ALT 0 - 35 U/L 8    Discharge instruction: per After Visit Summary and "Baby and Me Booklet".  After visit meds:  Allergies as of 09/12/2018   No Known Allergies     Medication List    TAKE these medications   acetaminophen 325 MG tablet Commonly known as:  TYLENOL Take 2 tablets (650 mg total) by mouth every 6 (six) hours as needed for up to 15 days for mild pain, moderate pain, fever or headache.   ibuprofen 600 MG tablet Commonly known as:  ADVIL,MOTRIN Take 1 tablet (600 mg total) by mouth every 6 (six) hours.   oxyCODONE 5 MG immediate release tablet Commonly known as:  Oxy IR/ROXICODONE Take 1 tablet (5 mg total) by mouth every 6 (six) hours as needed for up to 3 days (pain scale 4-7).   prenatal multivitamin Tabs tablet Take 1 tablet by mouth daily at 12 noon.   senna-docusate 8.6-50 MG tablet Commonly known as:  Senokot-S Take 2 tablets by mouth daily. Start taking on:  September 13, 2018      Diet: routine diet  Activity: Advance as tolerated. Pelvic rest for 6 weeks.   Outpatient follow up:4 weeks Follow up Appt: No future  appointments. Follow up Visit:  Please schedule this patient for Postpartum visit in: 4 weeks with the following provider: Any provider For C/S patients schedule nurse incision check in weeks 2 weeks: no Low risk pregnancy complicated by: none Delivery mode:  SVD Anticipated Birth Control:  Condoms PP Procedures needed: none  Schedule Integrated BH visit: no  Newborn Data: Live born female  Birth Weight:   APGAR: 9, 9  Newborn Delivery   Birth date/time:  09/11/2018 12:39:00 Delivery type:  Vaginal, Spontaneous     Baby Feeding: Bottle Disposition:home with mother   09/12/2018 Dollene Cleveland, DO  CNM attestation I have seen and examined this patient and agree with above documentation in the resident's note.   Tammy Knapp is a 33 y.o. [id number] s/p SVD.   Pain is well controlled.  Plan for birth control is condoms.  Method of Feeding: bottle  PE:  BP 90/65 (BP Location: Left Arm)   Pulse 76   Temp 97.8 F (36.6 C) (Oral)   Resp 16   Ht 5\' 4"  (1.626 m)   Wt 81.2 kg   LMP 11/30/2017 (Exact Date)   SpO2 100%   Breastfeeding Unknown   BMI 30.75 kg/m  Fundus firm  No results for input(s): HGB, HCT in the last 72 hours.   Plan: discharge today - postpartum care discussed - f/u clinic in 4 weeks for postpartum visit   Tammy Knapp, CNM 1:45 PM

## 2018-09-11 NOTE — MAU Note (Signed)
Pt having contractions since last night. No LOF or bleeding. +FM.

## 2018-09-11 NOTE — Progress Notes (Signed)
Patient ID: Tammy Knapp, female   DOB: 10-17-1985, 33 y.o.   MRN: [id number] Comfortable with epidural Vitals:   09/11/18 0530 09/11/18 0600 09/11/18 0630 09/11/18 0633  BP: (!) 105/57 105/60 95/71   Pulse: 87 80 81   Resp: 18 18 17    Temp:    98.1 F (36.7 C)  TempSrc:    Oral  SpO2:      Weight:      Height:       FHR stable Dilation: 8.5 Effacement (%): 100 Cervical Position: Middle Station: -2 Presentation: Vertex Exam by:: Research scientist (life sciences)   Anticipate SVD

## 2018-09-11 NOTE — Anesthesia Preprocedure Evaluation (Signed)

## 2018-09-11 NOTE — Anesthesia Procedure Notes (Signed)
Epidural Patient location during procedure: OB  Staffing Anesthesiologist: Daqwan Dougal, MD Performed: anesthesiologist   Preanesthetic Checklist Completed: patient identified, site marked, surgical consent, pre-op evaluation, timeout performed, IV checked, risks and benefits discussed and monitors and equipment checked  Epidural Patient position: sitting Prep: DuraPrep Patient monitoring: heart rate, continuous pulse ox and blood pressure Approach: right paramedian Location: L3-L4 Injection technique: LOR saline  Needle:  Needle type: Tuohy  Needle gauge: 17 G Needle length: 9 cm and 9 Needle insertion depth: 5 cm Catheter type: closed end flexible Catheter size: 20 Guage Catheter at skin depth: 9 cm Test dose: negative  Assessment Events: blood not aspirated, injection not painful, no injection resistance, negative IV test and no paresthesia  Additional Notes Patient identified. Risks/Benefits/Options discussed with patient including but not limited to bleeding, infection, nerve damage, paralysis, failed block, incomplete pain control, headache, blood pressure changes, nausea, vomiting, reactions to medication both or allergic, itching and postpartum back pain. Confirmed with bedside nurse the patient's most recent platelet count. Confirmed with patient that they are not currently taking any anticoagulation, have any bleeding history or any family history of bleeding disorders. Patient expressed understanding and wished to proceed. All questions were answered. Sterile technique was used throughout the entire procedure. Please see nursing notes for vital signs. Test dose was given through epidural needle and negative prior to continuing to dose epidural or start infusion. Warning signs of high block given to the patient including shortness of breath, tingling/numbness in hands, complete motor block, or any concerning symptoms with instructions to call for help. Patient was given  instructions on fall risk and not to get out of bed. All questions and concerns addressed with instructions to call with any issues.     

## 2018-09-11 NOTE — Lactation Note (Signed)
This note was copied from a baby's chart. Lactation Consultation Note  Patient Name: Girl Zafirah Caisse HTDSK'A Date: 09/11/2018   Spoke to [employer], mom is no longer BF, she's just going to do bottles, baby is on Enfamil formula. Lactation services no longer needed until further notice.    Interventions    Lactation Tools Discussed/Used     Consult Status      Rosmarie Esquibel Venetia Constable 09/11/2018, 8:13 PM

## 2018-09-11 NOTE — Anesthesia Pain Management Evaluation Note (Signed)
  CRNA Pain Management Visit Note  Patient: Tammy Knapp, 33 y.o., female  "Hello I am a member of the anesthesia team at [facility]. We have an anesthesia team available at all times to provide care throughout the hospital, including epidural management and anesthesia for C-section. I don't know your plan for the delivery whether it a natural birth, water birth, IV sedation, nitrous supplementation, doula or epidural, but we want to meet your pain goals."   1.Was your pain managed to your expectations on prior hospitalizations?   Yes   2.What is your expectation for pain management during this hospitalization?     Epidural and IV pain meds  3.How can we help you reach that goal? Epidural infusing, patient comfortable  Record the patient's initial score and the patient's pain goal.   Pain: 3  Pain Goal: 6 [facility] wants you to be able to say your pain was always managed very well.  [facility] 09/11/2018

## 2018-09-11 NOTE — H&P (Signed)
Tammy Knapp is a 33 y.o. female presenting for painful uterine contractions.    Gets care at [facility] office  Pregnancy has been remarkable for  Patient Active Problem List   Diagnosis Date Noted  . Marginal insertion of umbilical cord affecting management of mother 06/03/2018  . Fetal abnormality affecting management of mother, antepartum 05/06/2018  . Size of fetus inconsistent with dates, antepartum 05/06/2018  . Supervision of normal pregnancy 02/12/2018  . Decreased appetite 12/29/2015  . Acne vulgaris 08/29/2011   . OB History    Gravida  2   Para  1   Term  1   Preterm      AB      Living  1     SAB      TAB      Ectopic      Multiple      Live Births  1          Past Medical History:  Diagnosis Date  . No pertinent past medical history    Past Surgical History:  Procedure Laterality Date  . NO PAST SURGERIES     Family History: family history includes Healthy in her father and mother. Social History:  reports that she has never smoked. She has never used smokeless tobacco. She reports previous alcohol use. She reports that she does not use drugs.     Maternal Diabetes: No Genetic Screening: Normal Maternal Ultrasounds/Referrals: Abnormal:  Findings:   Isolated EIF (echogenic intracardiac focus), Isolated choroid plexus cyst, Other: Marginal cord insertion, CPC resolved Fetal Ultrasounds or other Referrals:  None Maternal Substance Abuse:  No Significant Maternal Medications:  None Significant Maternal Lab Results:  Lab values include: Group B Strep negative Other Comments:  None  Review of Systems  Constitutional: Negative for chills, fever and malaise/fatigue.  Eyes: Negative for blurred vision.  Cardiovascular: Negative for leg swelling.  Gastrointestinal: Positive for abdominal pain. Negative for constipation, diarrhea, nausea and vomiting.  Genitourinary: Negative for dysuria.  Neurological: Negative for focal weakness.   Maternal  Medical History:  Reason for admission: Contractions.  Nausea.  Contractions: Perceived severity is moderate.    Fetal activity: Perceived fetal activity is normal.   Last perceived fetal movement was within the past hour.    Prenatal complications: No bleeding, PIH, pre-eclampsia or preterm labor.   Prenatal Complications - Diabetes: none.    Dilation: 6.5 Effacement (%): 90 Station: -2 Exam by:: T Lytle RN  Blood pressure 111/71, pulse 89, resp. rate 18, height 5\' 4"  (1.626 m), weight 81.2 kg, last menstrual period 11/30/2017, SpO2 100 %. Maternal Exam:  Uterine Assessment: Contraction strength is moderate.  Contraction frequency is regular.   Abdomen: Patient reports no abdominal tenderness. Fetal presentation: vertex  Introitus: Normal vulva. Normal vagina.  Ferning test: not done.  Nitrazine test: not done. Amniotic fluid character: not assessed.  Pelvis: adequate for delivery.   Cervix: Cervix evaluated by digital exam.     Fetal Exam Fetal Monitor Review: Mode: ultrasound.   Baseline rate: 140.  Variability: moderate (6-25 bpm).   Pattern: no decelerations and accelerations present.    Fetal State Assessment: Category I - tracings are normal.     Physical Exam  Constitutional: She is oriented to person, place, and time. She appears well-developed and well-nourished. No distress.  HENT:  Head: Normocephalic.  Cardiovascular: Normal rate and regular rhythm.  Respiratory: Effort normal. No respiratory distress. She has no wheezes. She has no rales.  GI: Soft. She  exhibits no distension. There is no abdominal tenderness. There is no rebound and no guarding.  Genitourinary:    Vulva normal.     Genitourinary Comments: Dilation: 6.5 Effacement (%): 90 Cervical Position: Middle Station: -2 Presentation: Vertex Exam by:: Bari Mantis Lytle RN     Musculoskeletal: Normal range of motion.  Neurological: She is alert and oriented to person, place, and time.  Skin: Skin  is warm and dry.  Psychiatric: She has a normal mood and affect.    Prenatal labs: ABO, Rh: O/Positive/-- (06/25 0000) Antibody: Negative (06/25 0000) Rubella: 10.40 (06/25 0000) RPR: Non Reactive (10/24 0840)  HBsAg: Negative (06/25 0000)  HIV: Non Reactive (10/24 0840)  GBS:     Assessment/Plan: Single intrauterine pregnancy at 6762w1d Active labor  [facility] orders Epidural Anticipate SVD   Wynelle BourgeoisMarie Williams 09/11/2018, 1:31 AM

## 2018-09-12 ENCOUNTER — Encounter: Payer: No Typology Code available for payment source | Admitting: Family Medicine

## 2018-09-12 MED ORDER — IBUPROFEN 600 MG PO TABS: 600.0000 mg | ORAL_TABLET | Freq: Four times a day (QID) | ORAL | 0 refills | Status: DC

## 2018-09-12 MED ORDER — SENNOSIDES-DOCUSATE SODIUM 8.6-50 MG PO TABS: 2.0000 | ORAL_TABLET | ORAL | 0 refills | Status: DC

## 2018-09-12 MED ORDER — ACETAMINOPHEN 325 MG PO TABS: 650.0000 mg | ORAL_TABLET | Freq: Four times a day (QID) | ORAL | 0 refills | Status: AC | PRN

## 2018-09-12 MED ORDER — OXYCODONE HCL 5 MG PO TABS: 5.0000 mg | ORAL_TABLET | Freq: Four times a day (QID) | ORAL | 0 refills | Status: AC | PRN

## 2018-09-12 NOTE — Anesthesia Postprocedure Evaluation (Signed)
Anesthesia Post Note  Patient: Tammy Knapp  Procedure(s) Performed: AN AD HOC LABOR EPIDURAL     Patient location during evaluation: Mother Baby Anesthesia Type: Epidural Level of consciousness: awake, awake and alert and oriented Pain management: pain level controlled Vital Signs Assessment: post-procedure vital signs reviewed and stable Respiratory status: spontaneous breathing and respiratory function stable Cardiovascular status: blood pressure returned to baseline Postop Assessment: no headache, no backache, epidural receding, patient able to bend at knees, no apparent nausea or vomiting, adequate PO intake and able to ambulate Anesthetic complications: no    Last Vitals:  Vitals:   09/12/18 0000 09/12/18 0445  BP: (!) 96/54 90/65  Pulse: 90 76  Resp: 16 16  Temp: 36.6 C 36.6 C  SpO2: 98% 100%    Last Pain:  Vitals:   09/12/18 0445  TempSrc: Oral  PainSc:    Pain Goal:                   Cleda Clarks

## 2018-10-14 ENCOUNTER — Encounter: Payer: Self-pay | Admitting: Obstetrics & Gynecology

## 2018-10-14 ENCOUNTER — Ambulatory Visit (INDEPENDENT_AMBULATORY_CARE_PROVIDER_SITE_OTHER): Payer: No Typology Code available for payment source | Admitting: Obstetrics & Gynecology

## 2018-10-14 VITALS — BP 105/63 | HR 79 | Ht 64.0 in | Wt 153.1 lb

## 2018-10-14 DIAGNOSIS — Z1389 Encounter for screening for other disorder: Secondary | ICD-10-CM

## 2018-10-14 DIAGNOSIS — T733XXA Exhaustion due to excessive exertion, initial encounter: Secondary | ICD-10-CM

## 2018-10-14 NOTE — Progress Notes (Signed)
Subjective:     Tammy Knapp is a 33 y.o. female who presents for a postpartum visit. She is 4 weeks postpartum following a spontaneous vaginal delivery. I have fully reviewed the prenatal and intrapartum course. The delivery was at 39 gestational weeks. Outcome: spontaneous vaginal delivery. Anesthesia: epidural. Postpartum course has been unremarkable- she is not sleeping well. Baby's course has been uncomplicated. Baby is feeding by bottle Enfamil with iron. Bleeding no bleeding. Bowel function is normal. Bladder function is normal. Patient is not sexually active. Contraception method is condoms. Postpartum depression screening: negative. Pt reports that the baby is up all night and she does not sleep during the day because she has too much to do with cleaning and cooking etc.   The following portions of the patient's history were reviewed and updated as appropriate: allergies, current medications, past family history, past medical history, past social history, past surgical history and problem list.  Review of Systems Pertinent items are noted in HPI.   Objective:    LMP 11/30/2017 (Exact Date)       BP 105/63   Pulse 79   Ht 5\' 4"  (1.626 m)   Wt 153 lb 1.3 oz (69.4 kg)   LMP 11/30/2017 (Exact Date)   BMI 26.28 kg/m   CONSTITUTIONAL: Well-developed, well-nourished female in no acute distress.  HENT:  Normocephalic, atraumatic EYES: Conjunctivae and EOM are normal. No scleral icterus.  NECK: Normal range of motion SKIN: Skin is warm and dry. No rash noted. Not diaphoretic.No pallor. NEUROLGIC: Alert and oriented to person, place, and time. Normal coordination.  GU: EGBUS: no lesions; perineum intact Vagina: no blood in vault Cervix: no lesion; no mucopurulent d/c; no CMT Uterus: small, mobile Adnexa: no masses; non tender   Assessment:     4 weeks postpartum exam. Pap smear not done at today's visit.   Pt appears quite fatigued/  I have reviewed with her self care. No sx of  depression PHQ is neg.  Reviewed contraception options- will use condoms for 1 year and try to conceive again then requests LnIUD.   Plan:    1. Contraception: condoms 2. 5-6 weeks  3. Follow up in: 4 weeks to reassess fatigue level and mood or as needed.   4. reviewed with pt methods to get more rest. Reviewed establishing a 'QUIET TIME' at home during the day for toddler and herself.   Myrle Dues L. Harraway-Smith, M.D., [facility]

## 2018-10-28 ENCOUNTER — Encounter: Payer: Self-pay | Admitting: Family Medicine

## 2018-10-28 ENCOUNTER — Ambulatory Visit (INDEPENDENT_AMBULATORY_CARE_PROVIDER_SITE_OTHER): Payer: Self-pay | Admitting: Family Medicine

## 2018-10-28 VITALS — BP 100/80 | HR 85 | Temp 98.3°F | Resp 14 | Wt 153.8 lb

## 2018-10-28 DIAGNOSIS — R0981 Nasal congestion: Secondary | ICD-10-CM

## 2018-10-28 DIAGNOSIS — R6889 Other general symptoms and signs: Secondary | ICD-10-CM

## 2018-10-28 DIAGNOSIS — J069 Acute upper respiratory infection, unspecified: Secondary | ICD-10-CM

## 2018-10-28 LAB — POCT INFLUENZA A/B
Influenza A, POC: NEGATIVE
Influenza B, POC: NEGATIVE

## 2018-10-28 MED ORDER — BENZONATATE 100 MG PO CAPS: 100.0000 mg | ORAL_CAPSULE | Freq: Three times a day (TID) | ORAL | 0 refills | Status: DC | PRN

## 2018-10-28 MED ORDER — AZELASTINE HCL 0.1 % NA SOLN: 1.0000 | Freq: Two times a day (BID) | NASAL | 0 refills | Status: DC

## 2018-10-28 MED ORDER — CETIRIZINE HCL 10 MG PO TABS: 10.0000 mg | ORAL_TABLET | Freq: Every day | ORAL | 0 refills | Status: DC

## 2018-10-28 NOTE — Progress Notes (Signed)
Tammy Knapp is a 33 y.o. female who presents today with 3 days of abrupt onset sore throat, fatigue and runny nose. Patient denies recent travel for self or any one in her household. She is the mother of 2 small children and denies any known sick contacts or persons with similar symptoms. She has not perceived much relief from over the counter advil up to this point. She is generally healthy without chronic health conditions and is recently post partum with delivery in Jan 2020- was dx with flu in this clinic Dec 2019. She is not currently lactating and still under the care of OB.  Review of Systems  Constitutional: Positive for malaise/fatigue. Negative for chills and fever.  HENT: Positive for sore throat. Negative for congestion, ear discharge, ear pain and sinus pain.   Eyes: Negative.   Respiratory: Positive for cough. Negative for sputum production and shortness of breath.   Cardiovascular: Negative.  Negative for chest pain.  Gastrointestinal: Negative for abdominal pain, diarrhea, nausea and vomiting.  Genitourinary: Negative for dysuria, frequency, hematuria and urgency.  Musculoskeletal: Positive for myalgias.  Skin: Negative.   Neurological: Negative for headaches.  Endo/Heme/Allergies: Negative.   Psychiatric/Behavioral: Negative.     Tammy Knapp has a current medication list which includes the following prescription(s): ibuprofen, azelastine, benzonatate, cetirizine, prenatal multivitamin, and senna-docusate. Also has No Known Allergies.  Tammy Knapp  has a past medical history of No pertinent past medical history. Also  has a past surgical history that includes No past surgeries.    O: Vitals:   10/28/18 1902  BP: 100/80  Pulse: 85  Resp: 14  Temp: 98.3 F (36.8 C)  SpO2: 97%     Physical Exam Vitals signs (WNL) reviewed.  Constitutional:      General: She is not in acute distress.    Appearance: Normal appearance. She is well-developed. She is not ill-appearing,  toxic-appearing or diaphoretic.  HENT:     Head: Normocephalic.     Right Ear: Hearing, tympanic membrane, ear canal and external ear normal.     Left Ear: Hearing, tympanic membrane, ear canal and external ear normal.     Nose: Nose normal.     Mouth/Throat:     Pharynx: Uvula midline. No pharyngeal swelling, oropharyngeal exudate, posterior oropharyngeal erythema or uvula swelling.     Tonsils: No tonsillar exudate or tonsillar abscesses. Swelling: 1+ on the right. 1+ on the left.  Neck:     Musculoskeletal: Normal range of motion and neck supple.  Cardiovascular:     Rate and Rhythm: Normal rate and regular rhythm.     Pulses: Normal pulses.     Heart sounds: Normal heart sounds.  Pulmonary:     Effort: Pulmonary effort is normal.     Breath sounds: Normal breath sounds.  Abdominal:     General: Bowel sounds are normal.     Palpations: Abdomen is soft.  Musculoskeletal: Normal range of motion.  Lymphadenopathy:     Head:     Right side of head: No submental, submandibular or tonsillar adenopathy.     Left side of head: No submental, submandibular or tonsillar adenopathy.     Cervical: No cervical adenopathy.     Right cervical: No superficial cervical adenopathy.    Left cervical: No superficial cervical adenopathy.  Neurological:     Mental Status: She is alert and oriented to person, place, and time.  Psychiatric:        Behavior: Behavior is cooperative.  A: 1. Upper respiratory tract infection, unspecified type   2. Nasal congestion   3. Flu-like symptoms     P: 1. Upper respiratory tract infection, unspecified type Very normal exam although patient appears that she does not feel well- most prominent symptom on exam was mild nasal congestion. Discussed treatment plan of supportive care and continued monitoring of condition development in light of unremarkable exam, negative influenza test, normal vitals, and no recent travel.  Tylenol 650 mg -1000 mg as needed  for fever and fatigue, warm salt water gargles 3-4 times daily for sore throat; over the counter throat lozenges for throat pain, which patient will describe during exam as feeling itchy sometimes. Patient has lived in area over 16 years- potential concern for environmental allergy and patient denies any new foods or detergents, hx of allergy or anaphylaxis- will supportive treatment with antihistamine and continue to monitor.   2. Nasal congestion - azelastine (ASTELIN) 0.1 % nasal spray; Place 1 spray into both nostrils 2 (two) times daily. Use in each nostril as directed - cetirizine (ZYRTEC) 10 MG tablet; Take 1 tablet (10 mg total) by mouth daily.  3. Flu-like symptoms - POCT Influenza A/B Results for orders placed or performed in visit on 10/28/18 (from the past 24 hour(s))  POCT Influenza A/B     Status: Normal   Collection Time: 10/28/18  7:21 PM  Result Value Ref Range   Influenza A, POC Negative Negative   Influenza B, POC Negative Negative    Discussed with patient exam findings, suspected diagnosis etiology and  reviewed recommended treatment plan and follow up, including complications and indications for urgent medical follow up and evaluation. Medications including use and indications reviewed with patient. Patient provided relevant patient education on diagnosis and/or relevant related condition that were discussed and reviewed with patient at discharge. Patient verbalized understanding of information provided and agrees with plan of care (POC), all questions answered.

## 2018-10-28 NOTE — Patient Instructions (Addendum)
PLAN< Tylenol 650 mg -1000 mg as needed for fever and fatigue Warm salt water gargles 3-4 times daily for sore throat Over the counter throat lozenges for throat pain   Upper Respiratory Infection, Adult An upper respiratory infection (URI) affects the nose, throat, and upper air passages. URIs are caused by germs (viruses). The most common type of URI is often called "the common cold." Medicines cannot cure URIs, but you can do things at home to relieve your symptoms. URIs usually get better within 7-10 days. Follow these instructions at home: Activity  Rest as needed.  If you have a fever, stay home from work or school until your fever is gone, or until your doctor says you may return to work or school. ? You should stay home until you cannot spread the infection anymore (you are not contagious). ? Your doctor may have you wear a face mask so you have less risk of spreading the infection. Relieving symptoms  Gargle with a salt-water mixture 3-4 times a day or as needed. To make a salt-water mixture, completely dissolve -1 tsp of salt in 1 cup of warm water.  Use a cool-mist humidifier to add moisture to the air. This can help you breathe more easily. Eating and drinking   Drink enough fluid to keep your pee (urine) pale yellow.  Eat soups and other clear broths. General instructions   Take over-the-counter and prescription medicines only as told by your doctor. These include cold medicines, fever reducers, and cough suppressants.  Do not use any products that contain nicotine or tobacco. These include cigarettes and e-cigarettes. If you need help quitting, ask your doctor.  Avoid being where people are smoking (avoid secondhand smoke).  Make sure you get regular shots and get the flu shot every year.  Keep all follow-up visits as told by your doctor. This is important. How to avoid spreading infection to others   Wash your hands often with soap and water. If you do not  have soap and water, use hand sanitizer.  Avoid touching your mouth, face, eyes, or nose.  Cough or sneeze into a tissue or your sleeve or elbow. Do not cough or sneeze into your hand or into the air. Contact a doctor if:  You are getting worse, not better.  You have any of these: ? A fever. ? Chills. ? Brown or red mucus in your nose. ? Yellow or brown fluid (discharge)coming from your nose. ? Pain in your face, especially when you bend forward. ? Swollen neck glands. ? Pain with swallowing. ? White areas in the back of your throat. Get help right away if:  You have shortness of breath that gets worse.  You have very bad or constant: ? Headache. ? Ear pain. ? Pain in your forehead, behind your eyes, and over your cheekbones (sinus pain). ? Chest pain.  You have long-lasting (chronic) lung disease along with any of these: ? Wheezing. ? Long-lasting cough. ? Coughing up blood. ? A change in your usual mucus.  You have a stiff neck.  You have changes in your: ? Vision. ? Hearing. ? Thinking. ? Mood. Summary  An upper respiratory infection (URI) is caused by a germ called a virus. The most common type of URI is often called "the common cold."  URIs usually get better within 7-10 days.  Take over-the-counter and prescription medicines only as told by your doctor. This information is not intended to replace advice given to you by your health  care provider. Make sure you discuss any questions you have with your health care provider. Document Released: 01/24/2008 Document Revised: 03/30/2017 Document Reviewed: 03/30/2017 Elsevier Interactive Patient Education  2019 [employer].

## 2018-10-30 ENCOUNTER — Telehealth: Payer: Self-pay

## 2018-10-30 NOTE — Telephone Encounter (Signed)
Patient did not answered the phone 

## 2018-11-18 ENCOUNTER — Ambulatory Visit: Payer: No Typology Code available for payment source | Admitting: Obstetrics & Gynecology

## 2019-02-16 NOTE — Progress Notes (Addendum)
[facility] [address] [place], [place] [address] [phone]  Date:  02/17/2019   Name:  Tammy Knapp   DOB:  06-Sep-1985   MRN:  [id number]  PCP:  Pearline Cables, MD    Chief Complaint: Annual Exam   History of Present Illness:  Tammy Knapp is a 33 y.o. very pleasant female patient who presents with the following:  Here today for a CPE Last seen by myself about one year ago when she was newly pregnant  She now has 2 children, a 20-year-old daughter and her newborn.  She graduated from [employer] with a degree in biology Married, originally from [place]. She delivered toward the end of January-baby daughter  Her Pap and immunizations are up-to-date  Can do full labs today She is pretty busy taking care of her kids but states that she is feeling well mentally, no depression sx  She has noted numbness in her hands mostly at night. She has noted this for a few weeks  She feels like this affects all her fingers No weakness noted, no difficulty using her hands  She has had this in the past- it does not really bother her that much   She is not nursing the baby right now  She is not on contraception right now- she does not wish to be on contraception  Menses are back and regular.  I did advise her that she is likely fertile again   She is fasting today Her mood is good generally- she is just tired today   Patient Active Problem List   Diagnosis Date Noted  . Marginal insertion of umbilical cord affecting management of mother 06/03/2018  . Fetal abnormality affecting management of mother, antepartum 05/06/2018  . Size of fetus inconsistent with dates, antepartum 05/06/2018  . Supervision of normal pregnancy 02/12/2018  . Decreased appetite 12/29/2015  . Acne vulgaris 08/29/2011    Past Medical History:  Diagnosis Date  . No pertinent past medical history     Past Surgical History:  Procedure Laterality  Date  . NO PAST SURGERIES      Social History   Tobacco Use  . Smoking status: Never Smoker  . Smokeless tobacco: Never Used  Substance Use Topics  . Alcohol use: Not Currently  . Drug use: Never    Family History  Problem Relation Age of Onset  . Healthy Mother   . Healthy Father     No Known Allergies  Medication list has been reviewed and updated.  Current Outpatient Medications on File Prior to Visit  Medication Sig Dispense Refill  . azelastine (ASTELIN) 0.1 % nasal spray Place 1 spray into both nostrils 2 (two) times daily. Use in each nostril as directed 30 mL 0  . cetirizine (ZYRTEC) 10 MG tablet Take 1 tablet (10 mg total) by mouth daily. 30 tablet 0  . ibuprofen (ADVIL,MOTRIN) 600 MG tablet Take 1 tablet (600 mg total) by mouth every 6 (six) hours. 30 tablet 0  . Prenatal Vit-Fe Fumarate-FA (PRENATAL MULTIVITAMIN) TABS tablet Take 1 tablet by mouth daily at 12 noon.    . senna-docusate (SENOKOT-S) 8.6-50 MG tablet Take 2 tablets by mouth daily. 60 tablet 0   No current facility-administered medications on file prior to visit.     Review of Systems:  As per HPI- otherwise negative. No fever or chills   Physical Examination: Vitals:   02/17/19 1026  BP: 106/60  Pulse: 60  Resp: 16  Temp: 97.8 F (36.6 C)  SpO2: 98%   Vitals:   02/17/19 1026  Weight: 141 lb (64 kg)  Height: 5\' 4"  (1.626 m)   Body mass index is 24.2 kg/m. Ideal Body Weight: Weight in (lb) to have BMI = 25: 145.3  GEN: WDWN, NAD, Non-toxic, A & O x 3, normal weight, looks well  HEENT: Atraumatic, Normocephalic. Neck supple. No masses, No LAD.  Bilateral TM wnl, oropharynx normal.  PEERL,EOMI.   Ears and Nose: No external deformity. CV: RRR, No M/G/R. No JVD. No thrill. No extra heart sounds. PULM: CTA B, no wheezes, crackles, rhonchi. No retractions. No resp. distress. No accessory muscle use. ABD: S, NT, ND, +BS. No rebound. No HSM. EXTR: No c/c/e NEURO Normal gait.  PSYCH:  Normally interactive. Conversant. Not depressed or anxious appearing.  Calm demeanor.  Normal bilateral hand strength, sensation and circulation   Assessment and Plan:   ICD-10-CM   1. Physical exam  Z00.00   2. Screening for deficiency anemia  Z13.0 CBC  3. Screening for diabetes mellitus  Z13.1 Comprehensive metabolic panel    Hemoglobin A1c  4. Screening for hyperlipidemia  Z13.220 Lipid panel  5. Screening for thyroid disorder  Z13.29 TSH  6. Bilateral carpal tunnel syndrome  G56.03      Follow-up: No follow-ups on file.  No orders of the defined types were placed in this encounter.  Orders Placed This Encounter  Procedures  . CBC  . Comprehensive metabolic panel  . Hemoglobin A1c  . Lipid panel  . TSH   Following up for a CPE today Labs pending as above Advised her to try OTC wrist splints for CTS- if not helpful after 2-3 weeks please alert me, Sooner if worse.    Outpatient Encounter Medications as of 02/17/2019  Medication Sig  . azelastine (ASTELIN) 0.1 % nasal spray Place 1 spray into both nostrils 2 (two) times daily. Use in each nostril as directed  . cetirizine (ZYRTEC) 10 MG tablet Take 1 tablet (10 mg total) by mouth daily.  Marland Kitchen ibuprofen (ADVIL,MOTRIN) 600 MG tablet Take 1 tablet (600 mg total) by mouth every 6 (six) hours.  . Prenatal Vit-Fe Fumarate-FA (PRENATAL MULTIVITAMIN) TABS tablet Take 1 tablet by mouth daily at 12 noon.  . senna-docusate (SENOKOT-S) 8.6-50 MG tablet Take 2 tablets by mouth daily.   No facility-administered encounter medications on file as of 02/17/2019.      Signed Abbe Amsterdam, MD   Addendum 6/30, received her labs-message to patient  Results for orders placed or performed in visit on 02/17/19  CBC  Result Value Ref Range   WBC 5.0 4.0 - 10.5 K/uL   RBC 3.99 3.87 - 5.11 Mil/uL   Platelets 291.0 150.0 - 400.0 K/uL   Hemoglobin 12.5 12.0 - 15.0 g/dL   HCT 96.2 95.2 - 84.1 %   MCV 96.5 78.0 - 100.0 fl   MCHC 32.6 30.0  - 36.0 g/dL   RDW 32.4 40.1 - 02.7 %  Comprehensive metabolic panel  Result Value Ref Range   Sodium 138 135 - 145 mEq/L   Potassium 4.5 3.5 - 5.1 mEq/L   Chloride 106 96 - 112 mEq/L   CO2 23 19 - 32 mEq/L   Glucose, Bld 82 70 - 99 mg/dL   BUN 15 6 - 23 mg/dL   Creatinine, Ser 2.53 0.40 - 1.20 mg/dL   Total Bilirubin 0.3 0.2 - 1.2 mg/dL   Alkaline Phosphatase  64 39 - 117 U/L   AST 10 0 - 37 U/L   ALT 7 0 - 35 U/L   Total Protein 7.6 6.0 - 8.3 g/dL   Albumin 4.3 3.5 - 5.2 g/dL   Calcium 9.5 8.4 - 16.1 mg/dL   GFR 09.60 >45.40 mL/min  Hemoglobin A1c  Result Value Ref Range   Hgb A1c MFr Bld 5.2 4.6 - 6.5 %  Lipid panel  Result Value Ref Range   Cholesterol 166 0 - 200 mg/dL   Triglycerides 98.1 0.0 - 149.0 mg/dL   HDL 19.14 (L) >78.29 mg/dL   VLDL 56.2 0.0 - 13.0 mg/dL   LDL Cholesterol 865 (H) 0 - 99 mg/dL   Total CHOL/HDL Ratio 4    NonHDL 128.09   TSH  Result Value Ref Range   TSH 1.47 0.35 - 4.50 uIU/mL

## 2019-02-17 ENCOUNTER — Ambulatory Visit (INDEPENDENT_AMBULATORY_CARE_PROVIDER_SITE_OTHER): Payer: No Typology Code available for payment source | Admitting: Family Medicine

## 2019-02-17 ENCOUNTER — Other Ambulatory Visit: Payer: Self-pay

## 2019-02-17 ENCOUNTER — Encounter: Payer: Self-pay | Admitting: Family Medicine

## 2019-02-17 VITALS — BP 106/60 | HR 60 | Temp 97.8°F | Resp 16 | Ht 64.0 in | Wt 141.0 lb

## 2019-02-17 DIAGNOSIS — Z13220 Encounter for screening for lipoid disorders: Secondary | ICD-10-CM | POA: Diagnosis not present

## 2019-02-17 DIAGNOSIS — Z1329 Encounter for screening for other suspected endocrine disorder: Secondary | ICD-10-CM

## 2019-02-17 DIAGNOSIS — Z0000 Encounter for general adult medical examination without abnormal findings: Secondary | ICD-10-CM | POA: Diagnosis not present

## 2019-02-17 DIAGNOSIS — Z131 Encounter for screening for diabetes mellitus: Secondary | ICD-10-CM

## 2019-02-17 DIAGNOSIS — Z130 Encounter for screening for diseases of the blood and blood-forming organs and certain disorders involving the immune mechanism: Secondary | ICD-10-CM | POA: Diagnosis not present

## 2019-02-17 DIAGNOSIS — G5603 Carpal tunnel syndrome, bilateral upper limbs: Secondary | ICD-10-CM

## 2019-02-17 LAB — CBC
HCT: 38.5 % (ref 36.0–46.0)
Hemoglobin: 12.5 g/dL (ref 12.0–15.0)
MCHC: 32.6 g/dL (ref 30.0–36.0)
MCV: 96.5 fl (ref 78.0–100.0)
Platelets: 291 10*3/uL (ref 150.0–400.0)
RBC: 3.99 Mil/uL (ref 3.87–5.11)
RDW: 12.8 % (ref 11.5–15.5)
WBC: 5 10*3/uL (ref 4.0–10.5)

## 2019-02-17 LAB — TSH: TSH: 1.47 u[IU]/mL (ref 0.35–4.50)

## 2019-02-17 LAB — HEMOGLOBIN A1C: Hgb A1c MFr Bld: 5.2 % (ref 4.6–6.5)

## 2019-02-17 NOTE — Patient Instructions (Signed)
Great to see you again today and congrats on your second daughter! I will be in touch with your labs asap  I suspect that you have some carpal tunnel syndrome of your wrists.  Try using a carpal tunnel splint (just a splint that prevent you from bending the wrist forward) while you are asleep- I hope this will help after a couple of weeks.  If not helpful please do let me know.   Take care!   Health Maintenance, Female Adopting a healthy lifestyle and getting preventive care are important in promoting health and wellness. Ask your health care provider about:  The right schedule for you to have regular tests and exams.  Things you can do on your own to prevent diseases and keep yourself healthy. What should I know about diet, weight, and exercise? Eat a healthy diet   Eat a diet that includes plenty of vegetables, fruits, low-fat dairy products, and lean protein.  Do not eat a lot of foods that are high in solid fats, added sugars, or sodium. Maintain a healthy weight Body mass index (BMI) is used to identify weight problems. It estimates body fat based on height and weight. Your health care provider can help determine your BMI and help you achieve or maintain a healthy weight. Get regular exercise Get regular exercise. This is one of the most important things you can do for your health. Most adults should:  Exercise for at least 150 minutes each week. The exercise should increase your heart rate and make you sweat (moderate-intensity exercise).  Do strengthening exercises at least twice a week. This is in addition to the moderate-intensity exercise.  Spend less time sitting. Even light physical activity can be beneficial. Watch cholesterol and blood lipids Have your blood tested for lipids and cholesterol at 34 years of age, then have this test every 5 years. Have your cholesterol levels checked more often if:  Your lipid or cholesterol levels are high.  You are older than 34 years  of age.  You are at high risk for heart disease. What should I know about cancer screening? Depending on your health history and family history, you may need to have cancer screening at various ages. This may include screening for:  Breast cancer.  Cervical cancer.  Colorectal cancer.  Skin cancer.  Lung cancer. What should I know about heart disease, diabetes, and high blood pressure? Blood pressure and heart disease  High blood pressure causes heart disease and increases the risk of stroke. This is more likely to develop in people who have high blood pressure readings, are of African descent, or are overweight.  Have your blood pressure checked: ? Every 3-5 years if you are 5418-34 years of age. ? Every year if you are 34 years old or older. Diabetes Have regular diabetes screenings. This checks your fasting blood sugar level. Have the screening done:  Once every three years after age 34 if you are at a normal weight and have a low risk for diabetes.  More often and at a younger age if you are overweight or have a high risk for diabetes. What should I know about preventing infection? Hepatitis B If you have a higher risk for hepatitis B, you should be screened for this virus. Talk with your health care provider to find out if you are at risk for hepatitis B infection. Hepatitis C Testing is recommended for:  Everyone born from [birth year] through 1966.  Anyone with known risk factors for hepatitis  C. Sexually transmitted infections (STIs)  Get screened for STIs, including gonorrhea and chlamydia, if: ? You are sexually active and are younger than 34 years of age. ? You are older than 34 years of age and your health care provider tells you that you are at risk for this type of infection. ? Your sexual activity has changed since you were last screened, and you are at increased risk for chlamydia or gonorrhea. Ask your health care provider if you are at risk.  Ask your health care  provider about whether you are at high risk for HIV. Your health care provider may recommend a prescription medicine to help prevent HIV infection. If you choose to take medicine to prevent HIV, you should first get tested for HIV. You should then be tested every 3 months for as long as you are taking the medicine. Pregnancy  If you are about to stop having your period (premenopausal) and you may become pregnant, seek counseling before you get pregnant.  Take 400 to 800 micrograms (mcg) of folic acid every day if you become pregnant.  Ask for birth control (contraception) if you want to prevent pregnancy. Osteoporosis and menopause Osteoporosis is a disease in which the bones lose minerals and strength with aging. This can result in bone fractures. If you are 34 years old or older, or if you are at risk for osteoporosis and fractures, ask your health care provider if you should:  Be screened for bone loss.  Take a calcium or vitamin D supplement to lower your risk of fractures.  Be given hormone replacement therapy (HRT) to treat symptoms of menopause. Follow these instructions at home: Lifestyle  Do not use any products that contain nicotine or tobacco, such as cigarettes, e-cigarettes, and chewing tobacco. If you need help quitting, ask your health care provider.  Do not use street drugs.  Do not share needles.  Ask your health care provider for help if you need support or information about quitting drugs. Alcohol use  Do not drink alcohol if: ? Your health care provider tells you not to drink. ? You are pregnant, may be pregnant, or are planning to become pregnant.  If you drink alcohol: ? Limit how much you use to 0-1 drink a day. ? Limit intake if you are breastfeeding.  Be aware of how much alcohol is in your drink. In the [place] one drink equals one 12 oz bottle of beer (355 mL), one 5 oz glass of wine (148 mL), or one 1 oz glass of hard liquor (44 mL). General  instructions  Schedule regular health, dental, and eye exams.  Stay current with your vaccines.  Tell your health care provider if: ? You often feel depressed. ? You have ever been abused or do not feel safe at home. Summary  Adopting a healthy lifestyle and getting preventive care are important in promoting health and wellness.  Follow your health care provider's instructions about healthy diet, exercising, and getting tested or screened for diseases.  Follow your health care provider's instructions on monitoring your cholesterol and blood pressure. This information is not intended to replace advice given to you by your health care provider. Make sure you discuss any questions you have with your health care provider. Document Released: 02/20/2011 Document Revised: 07/31/2018 Document Reviewed: 07/31/2018 Elsevier Patient Education  2020 [employer].

## 2019-02-18 ENCOUNTER — Encounter: Payer: Self-pay | Admitting: Family Medicine

## 2019-02-18 LAB — COMPREHENSIVE METABOLIC PANEL
ALT: 7 U/L (ref 0–35)
AST: 10 U/L (ref 0–37)
Albumin: 4.3 g/dL (ref 3.5–5.2)
Alkaline Phosphatase: 64 U/L (ref 39–117)
BUN: 15 mg/dL (ref 6–23)
CO2: 23 mEq/L (ref 19–32)
Calcium: 9.5 mg/dL (ref 8.4–10.5)
Chloride: 106 mEq/L (ref 96–112)
Creatinine, Ser: 0.68 mg/dL (ref 0.40–1.20)
GFR: 98.91 mL/min (ref >60.00)
Glucose, Bld: 82 mg/dL (ref 70–99)
Potassium: 4.5 mEq/L (ref 3.5–5.1)
Sodium: 138 mEq/L (ref 135–145)
Total Bilirubin: 0.3 mg/dL (ref 0.2–1.2)
Total Protein: 7.6 g/dL (ref 6.0–8.3)

## 2019-02-18 LAB — LIPID PANEL
Cholesterol: 166 mg/dL (ref 0–200)
HDL: 38.4 mg/dL — ABNORMAL LOW (ref >39.00)
LDL Cholesterol: 111 mg/dL — ABNORMAL HIGH (ref 0–99)
NonHDL: 128.09
Total CHOL/HDL Ratio: 4
Triglycerides: 87 mg/dL (ref 0.0–149.0)
VLDL: 17.4 mg/dL (ref 0.0–40.0)

## 2019-05-09 ENCOUNTER — Other Ambulatory Visit: Payer: Self-pay

## 2019-05-09 ENCOUNTER — Ambulatory Visit (INDEPENDENT_AMBULATORY_CARE_PROVIDER_SITE_OTHER): Payer: No Typology Code available for payment source

## 2019-05-09 DIAGNOSIS — Z23 Encounter for immunization: Secondary | ICD-10-CM

## 2019-05-30 ENCOUNTER — Ambulatory Visit: Payer: No Typology Code available for payment source | Admitting: Family Medicine

## 2019-05-30 ENCOUNTER — Other Ambulatory Visit: Payer: Self-pay

## 2019-05-30 ENCOUNTER — Encounter: Payer: Self-pay | Admitting: Family Medicine

## 2019-05-30 DIAGNOSIS — R509 Fever, unspecified: Secondary | ICD-10-CM

## 2019-05-30 NOTE — Progress Notes (Signed)
Virtual Visit via Video Note  I connected with Tammy Knapp on 05/30/19 at  2:40 PM EDT by a video enabled telemedicine application and verified that I am speaking with the correct person using two identifiers.  Location: Patient: home with husband and baby Provider: office    I discussed the limitations of evaluation and management by telemedicine and the availability of in person appointments. The patient expressed understanding and agreed to proceed.  History of Present Illness: Pt is home c/o fever and body aches for 2 days,  + chills   No congestion  + dry cough     Observations/Objective: Pt in NAD  Assessment and Plan: Pt will not be charged -- we sent her to urgent care since our testing site is already closed  Follow Up Instructions:    I discussed the assessment and treatment plan with the patient. The patient was provided an opportunity to ask questions and all were answered. The patient agreed with the plan and demonstrated an understanding of the instructions.   The patient was advised to call back or seek an in-person evaluation if the symptoms worsen or if the condition fails to improve as anticipated.  I provided 15 minutes of non-face-to-face time during this encounter.   Ann Held, DO

## 2019-06-01 ENCOUNTER — Encounter: Payer: Self-pay | Admitting: Family Medicine

## 2019-06-02 ENCOUNTER — Encounter: Payer: Self-pay | Admitting: Family Medicine

## 2019-06-02 ENCOUNTER — Ambulatory Visit: Payer: Self-pay | Admitting: *Deleted

## 2019-06-02 MED ORDER — BENZONATATE 100 MG PO CAPS: 100.0000 mg | ORAL_CAPSULE | Freq: Three times a day (TID) | ORAL | 0 refills | Status: DC | PRN

## 2019-06-02 NOTE — Telephone Encounter (Signed)
Called her to discuss- she has sent 3 mychart messages today She tested positive for covid 19 on Friday Today she notes a dry cough She has not tried any cough medication OTC yet No fever She has body aches Her chest hurts when she coughs No distress or difficulty breathing per her report  She is taking advil as well Suggested that she try some OTC delsym and also called in tessalon perles for her to use Asked her to seek care if not getting better or if getting worse

## 2019-06-02 NOTE — Telephone Encounter (Signed)
Patient calls with non productive cough/congestion for about 4 days now. She was diagnosed covid19 positive yesterday. No difficulty breathing/wheezing N/V. No fever. Only tried warm black tea with honey for cough. Cough not keeping her up at night it does not hurt to breath deep.Encouraged increasing fluids daily and rest. Tylenol or ibuprofen for body aches and soreness from cough.Has humidifier running.  Explained these are some symptoms possibly related to covid. Recommended mucinex, Delsym, ocean nasal spray and cough drops during the day. Reviewed with any difficulty breathing, call back or be seen at the ED.Stated she understood all discussed.  Reason for Disposition . Zinc, vitamin C, and echinacea to treat the common cold, questions about  Answer Assessment - Initial Assessment Questions 1. ONSET: "When did the cough begin?"     4 days  2. SEVERITY: "How bad is the cough today?"      7 on the scale 3. RESPIRATORY DISTRESS: "Describe your breathing."      No wheeze or difficulty breathing 4. FEVER: "Do you have a fever?" If so, ask: "What is your temperature, how was it measured, and when did it start?"     no 5. HEMOPTYSIS: "Are you coughing up any blood?" If so ask: "How much?" (flecks, streaks, tablespoons, etc.)     no 6. TREATMENT: "What have you done so far to treat the cough?" (e.g., meds, fluids, humidifier)     Warm black tea with homey 7. CARDIAC HISTORY: "Do you have any history of heart disease?" (e.g., heart attack, congestive heart failure)      no 8. LUNG HISTORY: "Do you have any history of lung disease?"  (e.g., pulmonary embolus, asthma, emphysema)     no 9. PE RISK FACTORS: "Do you have a history of blood clots?" (or: recent major surgery, recent prolonged travel, bedridden)     no10. OTHER SYMPTOMS: "Do you have any other symptoms? (e.g., runny nose, wheezing, chest pain)       Congestion and runny nose 11. PREGNANCY: "Is there any chance you are pregnant?" "When  was your last menstrual period?"       no 12. TRAVEL: "Have you traveled out of the country in the last month?" (e.g., travel history, exposures)      no  Protocols used: COMMON COLD-A-AH, COUGH - ACUTE NON-PRODUCTIVE-A-AH

## 2019-06-02 NOTE — Telephone Encounter (Signed)
She needs to self quarantine for 14 days  I see she said she is constantly calling----  Her first message was today as far as I can see Messages do not come through over the weekend  If she needs something for cough she can try mucinex otc either plain or DM

## 2019-06-11 ENCOUNTER — Other Ambulatory Visit: Payer: Self-pay

## 2019-06-11 ENCOUNTER — Ambulatory Visit (INDEPENDENT_AMBULATORY_CARE_PROVIDER_SITE_OTHER): Payer: No Typology Code available for payment source | Admitting: Family Medicine

## 2019-06-11 ENCOUNTER — Encounter: Payer: Self-pay | Admitting: Family Medicine

## 2019-06-11 DIAGNOSIS — R5383 Other fatigue: Secondary | ICD-10-CM

## 2019-06-11 NOTE — Progress Notes (Signed)
[facility] [address], [address] [place], [place] [address] [phone]  Date:  06/11/2019   Name:  Tammy Knapp   DOB:  03/12/1986   MRN:  [id number]  PCP:  Pearline Cables, MD    Chief Complaint: No chief complaint on file.   History of Present Illness:  Tammy Knapp is a 33 y.o. very pleasant female patient who presents with the following:  Generally healthy young woman who I last saw in June of this year for routine physical Virtual visit today- pt is at home, I am at office  Patient identity confirmed with 2 factors, she gives consent for virtual today She has 2 children, a 81-year-old daughter and a 85 month old daughter  She is married, graduated from [employer]  She had a positive covid test earlier this month on 10/11 at [facility] [place] Her husband had it, and her father is in the hospital with Covid in [place] At that time she had a fever, chills, dry cough, headache She is overall feeling better but she is still tired-she is concerned that she may still be positive for Covid, but like to be retested if possible  No cough or SOB-denies any distress She is getting gradually better No nausea or vomiting She does not think she could be pregnant again   Patient Active Problem List   Diagnosis Date Noted  . Marginal insertion of umbilical cord affecting management of mother 06/03/2018  . Fetal abnormality affecting management of mother, antepartum 05/06/2018  . Size of fetus inconsistent with dates, antepartum 05/06/2018  . Supervision of normal pregnancy 02/12/2018  . Decreased appetite 12/29/2015  . Acne vulgaris 08/29/2011    Past Medical History:  Diagnosis Date  . No pertinent past medical history     Past Surgical History:  Procedure Laterality Date  . NO PAST SURGERIES      Social History   Tobacco Use  . Smoking status: Never Smoker  . Smokeless tobacco: Never Used  Substance Use Topics  . Alcohol  use: Not Currently  . Drug use: Never    Family History  Problem Relation Age of Onset  . Healthy Mother   . Healthy Father     No Known Allergies  Medication list has been reviewed and updated.  Current Outpatient Medications on File Prior to Visit  Medication Sig Dispense Refill  . azelastine (ASTELIN) 0.1 % nasal spray Place 1 spray into both nostrils 2 (two) times daily. Use in each nostril as directed 30 mL 0  . benzonatate (TESSALON) 100 MG capsule Take 1 capsule (100 mg total) by mouth 3 (three) times daily as needed for cough. 60 capsule 0  . cetirizine (ZYRTEC) 10 MG tablet Take 1 tablet (10 mg total) by mouth daily. 30 tablet 0  . ibuprofen (ADVIL,MOTRIN) 600 MG tablet Take 1 tablet (600 mg total) by mouth every 6 (six) hours. 30 tablet 0  . Prenatal Vit-Fe Fumarate-FA (PRENATAL MULTIVITAMIN) TABS tablet Take 1 tablet by mouth daily at 12 noon.    . senna-docusate (SENOKOT-S) 8.6-50 MG tablet Take 2 tablets by mouth daily. 60 tablet 0   No current facility-administered medications on file prior to visit.     Review of Systems:  As per HPI- otherwise negative.   Physical Examination: There were no vitals filed for this visit. There were no vitals filed for this visit. There is no height or weight on file to calculate  BMI. Ideal Body Weight:    Patient observed over video, she looks well No cough, wheezing, shortness of breath or distress is observed She is not taking any vital signs at home  Assessment and Plan: Other fatigue - Plan: SAR CoV2 Serology (COVID 19)AB(IGG)IA  Virtual visit today to discuss fatigue following diagnosis with COVID-19 about 10 days ago.  The patient, her husband and also her father have all tested positive.  Her acute respiratory symptoms are gradually improving, but she feels very tired.  She would like to be tested to make sure she is clear.  I ordered testing for her at the [facility] testing site, explained testing procedure to her.   She plans to be tested tomorrow She will let me know if any worsening or other concerns  Signed Lamar Blinks, MD

## 2019-06-12 ENCOUNTER — Other Ambulatory Visit: Payer: Self-pay

## 2019-06-12 DIAGNOSIS — Z20822 Contact with and (suspected) exposure to covid-19: Secondary | ICD-10-CM

## 2019-06-14 LAB — NOVEL CORONAVIRUS, NAA: SARS-CoV-2, NAA: NOT DETECTED

## 2019-08-14 ENCOUNTER — Ambulatory Visit (INDEPENDENT_AMBULATORY_CARE_PROVIDER_SITE_OTHER): Payer: No Typology Code available for payment source | Admitting: Family Medicine

## 2019-08-14 ENCOUNTER — Other Ambulatory Visit: Payer: Self-pay

## 2019-08-14 ENCOUNTER — Encounter: Payer: Self-pay | Admitting: Family Medicine

## 2019-08-14 VITALS — BP 105/72 | HR 73 | Temp 98.0°F | Resp 16 | Ht 64.0 in | Wt 126.0 lb

## 2019-08-14 DIAGNOSIS — G9331 Postviral fatigue syndrome: Secondary | ICD-10-CM

## 2019-08-14 DIAGNOSIS — R200 Anesthesia of skin: Secondary | ICD-10-CM | POA: Diagnosis not present

## 2019-08-14 DIAGNOSIS — G933 Postviral fatigue syndrome: Secondary | ICD-10-CM | POA: Diagnosis not present

## 2019-08-14 DIAGNOSIS — R634 Abnormal weight loss: Secondary | ICD-10-CM

## 2019-08-14 NOTE — Progress Notes (Addendum)
[facility] [address], [address] [place], [place] [address] [phone]  Date:  08/14/2019   Name:  Tammy Knapp   DOB:  [birth year]   MRN:  [id number]  PCP:  Pearline Cables, MD    Chief Complaint: Extremity Weakness (right arm pain, numbness, loss of appetite)   History of Present Illness:  Tammy Knapp is a 33 y.o. very pleasant female patient who presents with the following:  Virtual visit today for concern of illness Patient location is at office, provider location is home due to COVID-19 quarantine Patient identity confirmed with 2 factors, she gives consent for virtual visit today The patient and myself are present on today's visit  I saw her virtually in October.  She had a positive [facility] [place] Her sx at that time were cough, fever, loss of appetite   Today she has concern of fatigue and hand numbness She has continued to feel tired and weak since her recent illness, and has noted bilateral hand numbness for the last 2-3 weeks Her hands have also felt weak She had a similar issue with just her right hand intermittently in the past  No weakness in her legs She has a persistent backache No urinary symptoms She has noted decreased appetitie and has lost about 15 lbs without wishing to No vomiting No diarrhea No abd pain She feels like her taste and smell are ok LMP was earlier this month- the 9th or 10th-she does not suspect current pregnancy No cough or fever  She is not taking any medications right now    Wt Readings from Last 3 Encounters:  08/14/19 126 lb (57.2 kg)  02/17/19 141 lb (64 kg)  10/28/18 153 lb 12.8 oz (69.8 kg)     Patient Active Problem List   Diagnosis Date Noted  . Marginal insertion of umbilical cord affecting management of mother 06/03/2018  . Fetal abnormality affecting management of mother, antepartum 05/06/2018  . Size of fetus inconsistent  with dates, antepartum 05/06/2018  . Supervision of normal pregnancy 02/12/2018  . Decreased appetite 12/29/2015  . Acne vulgaris 08/29/2011    Past Medical History:  Diagnosis Date  . No pertinent past medical history     Past Surgical History:  Procedure Laterality Date  . NO PAST SURGERIES      Social History   Tobacco Use  . Smoking status: Never Smoker  . Smokeless tobacco: Never Used  Substance Use Topics  . Alcohol use: Not Currently  . Drug use: Never    Family History  Problem Relation Age of Onset  . Healthy Mother   . Healthy Father     No Known Allergies  Medication list has been reviewed and updated.  Current Outpatient Medications on File Prior to Visit  Medication Sig Dispense Refill  . azelastine (ASTELIN) 0.1 % nasal spray Place 1 spray into both nostrils 2 (two) times daily. Use in each nostril as directed 30 mL 0  . cetirizine (ZYRTEC) 10 MG tablet Take 1 tablet (10 mg total) by mouth daily. 30 tablet 0  . ibuprofen (ADVIL,MOTRIN) 600 MG tablet Take 1 tablet (600 mg total) by mouth every 6 (six) hours. 30 tablet 0  . Prenatal Vit-Fe Fumarate-FA (PRENATAL MULTIVITAMIN) TABS tablet Take 1 tablet by mouth daily at 12 noon.    . senna-docusate (SENOKOT-S) 8.6-50 MG tablet Take 2 tablets by mouth daily. 60 tablet 0  No current facility-administered medications on file prior to visit.    Review of Systems:  As per HPI- otherwise negative.   Physical Examination: Vitals:   08/14/19 1105  BP: 105/72  Pulse: 73  Resp: 16  Temp: 98 F (36.7 C)  SpO2: 99%   Vitals:   08/14/19 1105  Weight: 126 lb (57.2 kg)  Height: 5\' 4"  (1.626 m)   Body mass index is 21.63 kg/m. Ideal Body Weight: Weight in (lb) to have BMI = 25: 145.3  Patient observed over video monitor.  She looks well, no cough, wheezing, or distress is noted  Assessment and Plan: Postviral fatigue syndrome - Plan: CBC, Comprehensive metabolic panel, Vitamin D (25 hydroxy),  CANCELED: CBC, CANCELED: Comprehensive metabolic panel, CANCELED: Vitamin D (25 hydroxy)  Hand numbness - Plan: B12 and Folate Panel, CBC, Comprehensive metabolic panel, Ferritin, CANCELED: CBC, CANCELED: Comprehensive metabolic panel, CANCELED: Ferritin, CANCELED: B12 and Folate Panel  Weight loss - Plan: B-HCG Quant, TSH, CANCELED: TSH, CANCELED: B-HCG Quant  Virtual visit today for concern of persistent fatigue, backache, poor appetite, and likely carpal tunnel syndrome. She may simply still be getting over COVID-19, or this could be something new I counseled her regarding carpal tunnel splints, and suggested that she wear bilateral splints at least at night for the next 2 or 3 weeks Otherwise labs pending as above-we will be in touch with these results ASAP  Signed Lamar Blinks, MD  Received her labs 12/26-  Results for orders placed or performed in visit on 08/14/19  B12 and Folate Panel  Result Value Ref Range   Vitamin B-12 388 200 - 1,100 pg/mL   Folate 5.0 (L) ng/mL  B-HCG Quant  Result Value Ref Range   HCG, Total, QN <3 mIU/mL  CBC  Result Value Ref Range   WBC 5.2 3.8 - 10.8 Thousand/uL   RBC 3.79 (L) 3.80 - 5.10 Million/uL   Hemoglobin 11.8 11.7 - 15.5 g/dL   HCT 36.2 35.0 - 45.0 %   MCV 95.5 80.0 - 100.0 fL   MCH 31.1 27.0 - 33.0 pg   MCHC 32.6 32.0 - 36.0 g/dL   RDW 12.6 11.0 - 15.0 %   Platelets 262 140 - 400 Thousand/uL   MPV 11.6 7.5 - 12.5 fL  Comprehensive metabolic panel  Result Value Ref Range   Glucose, Bld 83 65 - 99 mg/dL   BUN 12 7 - 25 mg/dL   Creat 0.59 0.50 - 1.10 mg/dL   BUN/Creatinine Ratio NOT APPLICABLE 6 - 22 (calc)   Sodium 138 135 - 146 mmol/L   Potassium 4.2 3.5 - 5.3 mmol/L   Chloride 107 98 - 110 mmol/L   CO2 22 20 - 32 mmol/L   Calcium 9.1 8.6 - 10.2 mg/dL   Total Protein 6.9 6.1 - 8.1 g/dL   Albumin 4.1 3.6 - 5.1 g/dL   Globulin 2.8 1.9 - 3.7 g/dL (calc)   AG Ratio 1.5 1.0 - 2.5 (calc)   Total Bilirubin 0.3 0.2 - 1.2 mg/dL    Alkaline phosphatase (APISO) 51 31 - 125 U/L   AST 9 (L) 10 - 30 U/L   ALT 6 6 - 29 U/L  Ferritin  Result Value Ref Range   Ferritin 40 16 - 154 ng/mL  TSH  Result Value Ref Range   TSH 1.41 mIU/L  Vitamin D (25 hydroxy)  Result Value Ref Range   Vit D, 25-Hydroxy 26 (L) 30 - 100 ng/mL   Message to pt

## 2019-08-14 NOTE — Patient Instructions (Signed)
It was very nice to see you again today, but I am sorry you are still not feeling well.  For your hand symptoms, try wearing wrist splints which prevent you from bending your wrists at night for 2 or 3 weeks.  If your numbness is due to carpal tunnel syndrome, this should help  I am also going to do some blood work today to look for an explanation for your weight loss and fatigue.  You may simply still be getting over COVID-19, we will look for any other explanation  Please let me know if you are getting worse, I will be in touch with your labs as soon as possible

## 2019-08-15 LAB — CBC
HCT: 36.2 % (ref 35.0–45.0)
Hemoglobin: 11.8 g/dL (ref 11.7–15.5)
MCH: 31.1 pg (ref 27.0–33.0)
MCHC: 32.6 g/dL (ref 32.0–36.0)
MCV: 95.5 fL (ref 80.0–100.0)
MPV: 11.6 fL (ref 7.5–12.5)
Platelets: 262 10*3/uL (ref 140–400)
RBC: 3.79 10*6/uL — ABNORMAL LOW (ref 3.80–5.10)
RDW: 12.6 % (ref 11.0–15.0)
WBC: 5.2 10*3/uL (ref 3.8–10.8)

## 2019-08-15 LAB — COMPREHENSIVE METABOLIC PANEL
AG Ratio: 1.5 (calc) (ref 1.0–2.5)
ALT: 6 U/L (ref 6–29)
AST: 9 U/L — ABNORMAL LOW (ref 10–30)
Albumin: 4.1 g/dL (ref 3.6–5.1)
Alkaline phosphatase (APISO): 51 U/L (ref 31–125)
BUN: 12 mg/dL (ref 7–25)
CO2: 22 mmol/L (ref 20–32)
Calcium: 9.1 mg/dL (ref 8.6–10.2)
Chloride: 107 mmol/L (ref 98–110)
Creat: 0.59 mg/dL (ref 0.50–1.10)
Globulin: 2.8 g/dL (calc) (ref 1.9–3.7)
Glucose, Bld: 83 mg/dL (ref 65–99)
Potassium: 4.2 mmol/L (ref 3.5–5.3)
Sodium: 138 mmol/L (ref 135–146)
Total Bilirubin: 0.3 mg/dL (ref 0.2–1.2)
Total Protein: 6.9 g/dL (ref 6.1–8.1)

## 2019-08-15 LAB — B12 AND FOLATE PANEL
Folate: 5 ng/mL — ABNORMAL LOW
Vitamin B-12: 388 pg/mL (ref 200–1100)

## 2019-08-15 LAB — FERRITIN: Ferritin: 40 ng/mL (ref 16–154)

## 2019-08-15 LAB — TSH: TSH: 1.41 mIU/L

## 2019-08-15 LAB — VITAMIN D 25 HYDROXY (VIT D DEFICIENCY, FRACTURES): Vit D, 25-Hydroxy: 26 ng/mL — ABNORMAL LOW (ref 30–100)

## 2019-08-15 LAB — HCG, QUANTITATIVE, PREGNANCY: HCG, Total, QN: <3 m[IU]/mL

## 2019-08-16 ENCOUNTER — Encounter: Payer: Self-pay | Admitting: Family Medicine

## 2020-01-17 ENCOUNTER — Ambulatory Visit: Payer: No Typology Code available for payment source | Attending: Internal Medicine

## 2020-01-17 DIAGNOSIS — Z23 Encounter for immunization: Secondary | ICD-10-CM

## 2020-01-17 NOTE — Progress Notes (Signed)
   Covid-19 Vaccination Clinic  Name:  Tammy Knapp    MRN: [id number] DOB: 12-20-1985  01/17/2020  [name] was observed post Covid-19 immunization for 15 minutes without incident. She was provided with Vaccine Information Sheet and instruction to access the V-Safe system.   [name] was instructed to call 911 with any severe reactions post vaccine: Marland Kitchen Difficulty breathing  . Swelling of face and throat  . A fast heartbeat  . A bad rash all over body  . Dizziness and weakness   Immunizations Administered    Name Date Dose VIS Date Route   Pfizer COVID-19 Vaccine 01/17/2020 11:11 AM 0.3 mL 10/15/2018 Intramuscular   Manufacturer: [employer], [employer]   Lot: [id number]   NDC: [address]-4

## 2020-02-09 ENCOUNTER — Ambulatory Visit: Payer: No Typology Code available for payment source | Attending: Internal Medicine

## 2020-02-09 DIAGNOSIS — Z23 Encounter for immunization: Secondary | ICD-10-CM

## 2020-02-09 NOTE — Progress Notes (Signed)
   Covid-19 Vaccination Clinic  Name:  Carlette Palmatier    MRN: [id number] DOB: 06-27-1986  02/09/2020  Ms. Obando was observed post Covid-19 immunization for 15 minutes without incident. She was provided with Vaccine Information Sheet and instruction to access the V-Safe system.   Ms. Vanacker was instructed to call 911 with any severe reactions post vaccine: Marland Kitchen Difficulty breathing  . Swelling of face and throat  . A fast heartbeat  . A bad rash all over body  . Dizziness and weakness   Immunizations Administered    Name Date Dose VIS Date Route   Pfizer COVID-19 Vaccine 02/09/2020 11:57 AM 0.3 mL 10/15/2018 Intramuscular   Manufacturer: [employer], [employer]   Lot: [id number]   NDC: [address]-1

## 2020-02-16 NOTE — Patient Instructions (Addendum)
It was great to see you again today, I will be in touch with your labs as soon as possible Try to get some exercise most days of the week!  Walking or doing some aerobics at home is a good place to start    Health Maintenance, Female Adopting a healthy lifestyle and getting preventive care are important in promoting health and wellness. Ask your health care provider about:  The right schedule for you to have regular tests and exams.  Things you can do on your own to prevent diseases and keep yourself healthy. What should I know about diet, weight, and exercise? Eat a healthy diet   Eat a diet that includes plenty of vegetables, fruits, low-fat dairy products, and lean protein.  Do not eat a lot of foods that are high in solid fats, added sugars, or sodium. Maintain a healthy weight Body mass index (BMI) is used to identify weight problems. It estimates body fat based on height and weight. Your health care provider can help determine your BMI and help you achieve or maintain a healthy weight. Get regular exercise Get regular exercise. This is one of the most important things you can do for your health. Most adults should:  Exercise for at least 150 minutes each week. The exercise should increase your heart rate and make you sweat (moderate-intensity exercise).  Do strengthening exercises at least twice a week. This is in addition to the moderate-intensity exercise.  Spend less time sitting. Even light physical activity can be beneficial. Watch cholesterol and blood lipids Have your blood tested for lipids and cholesterol at 35 years of age, then have this test every 5 years. Have your cholesterol levels checked more often if:  Your lipid or cholesterol levels are high.  You are older than 35 years of age.  You are at high risk for heart disease. What should I know about cancer screening? Depending on your health history and family history, you may need to have cancer screening at  various ages. This may include screening for:  Breast cancer.  Cervical cancer.  Colorectal cancer.  Skin cancer.  Lung cancer. What should I know about heart disease, diabetes, and high blood pressure? Blood pressure and heart disease  High blood pressure causes heart disease and increases the risk of stroke. This is more likely to develop in people who have high blood pressure readings, are of African descent, or are overweight.  Have your blood pressure checked: ? Every 3-5 years if you are 47-72 years of age. ? Every year if you are 42 years old or older. Diabetes Have regular diabetes screenings. This checks your fasting blood sugar level. Have the screening done:  Once every three years after age 12 if you are at a normal weight and have a low risk for diabetes.  More often and at a younger age if you are overweight or have a high risk for diabetes. What should I know about preventing infection? Hepatitis B If you have a higher risk for hepatitis B, you should be screened for this virus. Talk with your health care provider to find out if you are at risk for hepatitis B infection. Hepatitis C Testing is recommended for:  Everyone born from [birth year] through 1966.  Anyone with known risk factors for hepatitis C. Sexually transmitted infections (STIs)  Get screened for STIs, including gonorrhea and chlamydia, if: ? You are sexually active and are younger than 35 years of age. ? You are older than 35  years of age and your health care provider tells you that you are at risk for this type of infection. ? Your sexual activity has changed since you were last screened, and you are at increased risk for chlamydia or gonorrhea. Ask your health care provider if you are at risk.  Ask your health care provider about whether you are at high risk for HIV. Your health care provider may recommend a prescription medicine to help prevent HIV infection. If you choose to take medicine to prevent  HIV, you should first get tested for HIV. You should then be tested every 3 months for as long as you are taking the medicine. Pregnancy  If you are about to stop having your period (premenopausal) and you may become pregnant, seek counseling before you get pregnant.  Take 400 to 800 micrograms (mcg) of folic acid every day if you become pregnant.  Ask for birth control (contraception) if you want to prevent pregnancy. Osteoporosis and menopause Osteoporosis is a disease in which the bones lose minerals and strength with aging. This can result in bone fractures. If you are 56 years old or older, or if you are at risk for osteoporosis and fractures, ask your health care provider if you should:  Be screened for bone loss.  Take a calcium or vitamin D supplement to lower your risk of fractures.  Be given hormone replacement therapy (HRT) to treat symptoms of menopause. Follow these instructions at home: Lifestyle  Do not use any products that contain nicotine or tobacco, such as cigarettes, e-cigarettes, and chewing tobacco. If you need help quitting, ask your health care provider.  Do not use street drugs.  Do not share needles.  Ask your health care provider for help if you need support or information about quitting drugs. Alcohol use  Do not drink alcohol if: ? Your health care provider tells you not to drink. ? You are pregnant, may be pregnant, or are planning to become pregnant.  If you drink alcohol: ? Limit how much you use to 0-1 drink a day. ? Limit intake if you are breastfeeding.  Be aware of how much alcohol is in your drink. In the [place] one drink equals one 12 oz bottle of beer (355 mL), one 5 oz glass of wine (148 mL), or one 1 oz glass of hard liquor (44 mL). General instructions  Schedule regular health, dental, and eye exams.  Stay current with your vaccines.  Tell your health care provider if: ? You often feel depressed. ? You have ever been abused or do  not feel safe at home. Summary  Adopting a healthy lifestyle and getting preventive care are important in promoting health and wellness.  Follow your health care provider's instructions about healthy diet, exercising, and getting tested or screened for diseases.  Follow your health care provider's instructions on monitoring your cholesterol and blood pressure. This information is not intended to replace advice given to you by your health care provider. Make sure you discuss any questions you have with your health care provider. Document Revised: 07/31/2018 Document Reviewed: 07/31/2018 Elsevier Patient Education  2020 [employer].

## 2020-02-16 NOTE — Progress Notes (Addendum)
[facility] [address], [address] [place], [place] [address] [phone]  Date:  02/18/2020   Name:  Tammy Knapp   DOB:  [birth year]   MRN:  [id number]  PCP:  Pearline Cables, MD    Chief Complaint: Annual Exam (no pap)   History of Present Illness:  Tammy Knapp is a 34 y.o. very pleasant female patient who presents with the following:  Generally healthy young woman here today for physical exam Last seen by myself for virtual visit in December.  She did test positive for COVID-19 last year She feels like she has recovered fully  Lab work done in December showed low folate, low vitamin D  She is married, graduated from [employer].  She has 2 young daughters; she is a Futures trader. Her daughters are 47 and 49 year old   She has generally been in good health, does not have any particular concerns today.  No bothersome symptoms  Pap is up-to-date-repeat next year.  Negative Pap with negative HPV in 2019 COVID-19 series complete Can do routine lab work today, hep C screening  She does not get a lot of exercise Patient Active Problem List   Diagnosis Date Noted  . Marginal insertion of umbilical cord affecting management of mother 06/03/2018  . Fetal abnormality affecting management of mother, antepartum 05/06/2018  . Size of fetus inconsistent with dates, antepartum 05/06/2018  . Supervision of normal pregnancy 02/12/2018  . Decreased appetite 12/29/2015  . Acne vulgaris 08/29/2011    Past Medical History:  Diagnosis Date  . No pertinent past medical history     Past Surgical History:  Procedure Laterality Date  . NO PAST SURGERIES      Social History   Tobacco Use  . Smoking status: Never Smoker  . Smokeless tobacco: Never Used  Vaping Use  . Vaping Use: Never used  Substance Use Topics  . Alcohol use: Not Currently  . Drug use: Never    Family History  Problem Relation Age of Onset  . Healthy Mother   .  Healthy Father     No Known Allergies  Medication list has been reviewed and updated.  Current Outpatient Medications on File Prior to Visit  Medication Sig Dispense Refill  . cholecalciferol (VITAMIN D3) 25 MCG (1000 UNIT) tablet Take 1,000 Units by mouth daily.    . folic acid (FOLVITE) 1 MG tablet Take 1 mg by mouth daily.     No current facility-administered medications on file prior to visit.    Review of Systems:  As per HPI- otherwise negative.    Physical Examination: Vitals:   02/18/20 1521  BP: 100/60  Pulse: 86  Resp: 16  SpO2: 98%   Vitals:   02/18/20 1521  Weight: 124 lb (56.2 kg)  Height: 5\' 4"  (1.626 m)   Body mass index is 21.28 kg/m. Ideal Body Weight: Weight in (lb) to have BMI = 25: 145.3  GEN: no acute distress.  Petite build, looks well HEENT: Atraumatic, Normocephalic.   Bilateral TM wnl, oropharynx normal.  PEERL,EOMI.   Ears and Nose: No external deformity. CV: RRR, No M/G/R. No JVD. No thrill. No extra heart sounds. PULM: CTA B, no wheezes, crackles, rhonchi. No retractions. No resp. distress. No accessory muscle use. ABD: S, NT, ND. No rebound. No HSM. EXTR: No c/c/e PSYCH: Normally interactive. Conversant.    Assessment and Plan: Physical exam  Screening for deficiency anemia - Plan:  CBC  Screening for diabetes mellitus - Plan: Comprehensive metabolic panel, Hemoglobin A1c  Screening for hyperlipidemia - Plan: Lipid panel  Screening for thyroid disorder - Plan: TSH  Vitamin D deficiency - Plan: VITAMIN D 25 Hydroxy (Vit-D Deficiency, Fractures)  Folate deficiency - Plan: Folate  Encounter for hepatitis C screening test for low risk patient - Plan: Hepatitis C antibody  Patient is here today for physical exam.  She is generally in very good health, is a homemaker with 2 young daughters Labs pending as above We did cancel the hepatitis C screening as this does not appear to be covered by her insurance, she is certainly low  risk Will plan further follow- up pending labs. Encouraged her to work on a more regular exercise program as she is able Will plan further follow- up pending labs.  This visit occurred during the SARS-CoV-2 public health emergency.  Safety protocols were in place, including screening questions prior to the visit, additional usage of staff PPE, and extensive cleaning of exam room while observing appropriate contact time as indicated for disinfecting solutions.    Signed Lamar Blinks, MD  Received her labs as below, message to pt  Results for orders placed or performed in visit on 02/18/20  CBC  Result Value Ref Range   WBC 8.9 4.0 - 10.5 K/uL   RBC 3.72 (L) 3.87 - 5.11 Mil/uL   Platelets 260.0 150 - 400 K/uL   Hemoglobin 11.7 (L) 12.0 - 15.0 g/dL   HCT 35.1 (L) 36 - 46 %   MCV 94.4 78.0 - 100.0 fl   MCHC 33.4 30.0 - 36.0 g/dL   RDW 13.0 11.5 - 15.5 %  Comprehensive metabolic panel  Result Value Ref Range   Sodium 139 135 - 145 mEq/L   Potassium 4.5 3.5 - 5.1 mEq/L   Chloride 105 96 - 112 mEq/L   CO2 27 19 - 32 mEq/L   Glucose, Bld 87 70 - 99 mg/dL   BUN 18 6 - 23 mg/dL   Creatinine, Ser 0.73 0.40 - 1.20 mg/dL   Total Bilirubin 0.3 0.2 - 1.2 mg/dL   Alkaline Phosphatase 53 39 - 117 U/L   AST 10 0 - 37 U/L   ALT 9 0 - 35 U/L   Total Protein 7.3 6.0 - 8.3 g/dL   Albumin 4.3 3.5 - 5.2 g/dL   GFR 90.60 >60.00 mL/min   Calcium 9.8 8.4 - 10.5 mg/dL  Hemoglobin A1c  Result Value Ref Range   Hgb A1c MFr Bld 5.2 4.6 - 6.5 %  Lipid panel  Result Value Ref Range   Cholesterol 147 0 - 200 mg/dL   Triglycerides 71.0 0 - 149 mg/dL   HDL 43.20 >39.00 mg/dL   VLDL 14.2 0.0 - 40.0 mg/dL   LDL Cholesterol 90 0 - 99 mg/dL   Total CHOL/HDL Ratio 3    NonHDL 104.22   TSH  Result Value Ref Range   TSH 1.33 0.35 - 4.50 uIU/mL  Folate  Result Value Ref Range   Folate >24.8 >5.9 ng/mL  VITAMIN D 25 Hydroxy (Vit-D Deficiency, Fractures)  Result Value Ref Range   VITD 30.98 30.00 -  100.00 ng/mL

## 2020-02-18 ENCOUNTER — Other Ambulatory Visit: Payer: Self-pay

## 2020-02-18 ENCOUNTER — Encounter: Payer: Self-pay | Admitting: Family Medicine

## 2020-02-18 ENCOUNTER — Ambulatory Visit (INDEPENDENT_AMBULATORY_CARE_PROVIDER_SITE_OTHER): Payer: No Typology Code available for payment source | Admitting: Family Medicine

## 2020-02-18 ENCOUNTER — Telehealth: Payer: Self-pay | Admitting: *Deleted

## 2020-02-18 VITALS — BP 100/60 | HR 86 | Resp 16 | Ht 64.0 in | Wt 124.0 lb

## 2020-02-18 DIAGNOSIS — Z1329 Encounter for screening for other suspected endocrine disorder: Secondary | ICD-10-CM | POA: Diagnosis not present

## 2020-02-18 DIAGNOSIS — E559 Vitamin D deficiency, unspecified: Secondary | ICD-10-CM | POA: Diagnosis not present

## 2020-02-18 DIAGNOSIS — R300 Dysuria: Secondary | ICD-10-CM

## 2020-02-18 DIAGNOSIS — E538 Deficiency of other specified B group vitamins: Secondary | ICD-10-CM

## 2020-02-18 DIAGNOSIS — Z1159 Encounter for screening for other viral diseases: Secondary | ICD-10-CM

## 2020-02-18 DIAGNOSIS — Z13220 Encounter for screening for lipoid disorders: Secondary | ICD-10-CM | POA: Diagnosis not present

## 2020-02-18 DIAGNOSIS — Z0000 Encounter for general adult medical examination without abnormal findings: Secondary | ICD-10-CM | POA: Diagnosis not present

## 2020-02-18 DIAGNOSIS — Z131 Encounter for screening for diabetes mellitus: Secondary | ICD-10-CM | POA: Diagnosis not present

## 2020-02-18 DIAGNOSIS — Z130 Encounter for screening for diseases of the blood and blood-forming organs and certain disorders involving the immune mechanism: Secondary | ICD-10-CM

## 2020-02-18 NOTE — Telephone Encounter (Signed)
Sent mychart message to update her regarding Hep C test drawn at her lab visit today.

## 2020-02-18 NOTE — Addendum Note (Signed)
Addended by: Mervin Kung A on: 02/18/2020 04:47 PM   Modules accepted: Orders

## 2020-02-19 ENCOUNTER — Encounter: Payer: No Typology Code available for payment source | Admitting: Family Medicine

## 2020-02-19 ENCOUNTER — Encounter: Payer: Self-pay | Admitting: Family Medicine

## 2020-02-19 LAB — CBC
HCT: 35.1 % — ABNORMAL LOW (ref 36.0–46.0)
Hemoglobin: 11.7 g/dL — ABNORMAL LOW (ref 12.0–15.0)
MCHC: 33.4 g/dL (ref 30.0–36.0)
MCV: 94.4 fl (ref 78.0–100.0)
Platelets: 260 10*3/uL (ref 150.0–400.0)
RBC: 3.72 Mil/uL — ABNORMAL LOW (ref 3.87–5.11)
RDW: 13 % (ref 11.5–15.5)
WBC: 8.9 10*3/uL (ref 4.0–10.5)

## 2020-02-19 LAB — COMPREHENSIVE METABOLIC PANEL
ALT: 9 U/L (ref 0–35)
AST: 10 U/L (ref 0–37)
Albumin: 4.3 g/dL (ref 3.5–5.2)
Alkaline Phosphatase: 53 U/L (ref 39–117)
BUN: 18 mg/dL (ref 6–23)
CO2: 27 mEq/L (ref 19–32)
Calcium: 9.8 mg/dL (ref 8.4–10.5)
Chloride: 105 mEq/L (ref 96–112)
Creatinine, Ser: 0.73 mg/dL (ref 0.40–1.20)
GFR: 90.6 mL/min (ref >60.00)
Glucose, Bld: 87 mg/dL (ref 70–99)
Potassium: 4.5 mEq/L (ref 3.5–5.1)
Sodium: 139 mEq/L (ref 135–145)
Total Bilirubin: 0.3 mg/dL (ref 0.2–1.2)
Total Protein: 7.3 g/dL (ref 6.0–8.3)

## 2020-02-19 LAB — VITAMIN D 25 HYDROXY (VIT D DEFICIENCY, FRACTURES): VITD: 30.98 ng/mL (ref 30.00–100.00)

## 2020-02-19 LAB — LIPID PANEL
Cholesterol: 147 mg/dL (ref 0–200)
HDL: 43.2 mg/dL (ref >39.00)
LDL Cholesterol: 90 mg/dL (ref 0–99)
NonHDL: 104.22
Total CHOL/HDL Ratio: 3
Triglycerides: 71 mg/dL (ref 0.0–149.0)
VLDL: 14.2 mg/dL (ref 0.0–40.0)

## 2020-02-19 LAB — FOLATE: Folate: >24.8 ng/mL (ref >5.9)

## 2020-02-19 LAB — TSH: TSH: 1.33 u[IU]/mL (ref 0.35–4.50)

## 2020-02-19 LAB — HEMOGLOBIN A1C: Hgb A1c MFr Bld: 5.2 % (ref 4.6–6.5)

## 2020-05-26 ENCOUNTER — Ambulatory Visit (INDEPENDENT_AMBULATORY_CARE_PROVIDER_SITE_OTHER): Payer: No Typology Code available for payment source

## 2020-05-26 ENCOUNTER — Other Ambulatory Visit: Payer: Self-pay

## 2020-05-26 DIAGNOSIS — Z23 Encounter for immunization: Secondary | ICD-10-CM | POA: Diagnosis not present

## 2020-05-26 NOTE — Progress Notes (Signed)
Patient here for flu shot

## 2020-08-27 ENCOUNTER — Telehealth (INDEPENDENT_AMBULATORY_CARE_PROVIDER_SITE_OTHER): Payer: No Typology Code available for payment source | Admitting: Internal Medicine

## 2020-08-27 ENCOUNTER — Other Ambulatory Visit: Payer: Self-pay

## 2020-08-27 ENCOUNTER — Encounter: Payer: Self-pay | Admitting: Internal Medicine

## 2020-08-27 DIAGNOSIS — Z20822 Contact with and (suspected) exposure to covid-19: Secondary | ICD-10-CM | POA: Diagnosis not present

## 2020-08-27 MED ORDER — PROMETHAZINE-DM 6.25-15 MG/5ML PO SYRP: 5.0000 mL | ORAL_SOLUTION | Freq: Three times a day (TID) | ORAL | 0 refills | Status: DC | PRN

## 2020-08-27 NOTE — Progress Notes (Signed)
Virtual Visit via Video Note  I connected with Tammy Knapp on 08/27/20 at  3:40 PM EST by a video enabled telemedicine application and verified that I am speaking with the correct person using two identifiers.  The patient and the provider were at separate locations throughout the entire encounter. Patient location: home, Provider location: work   I discussed the limitations of evaluation and management by telemedicine and the availability of in person appointments. The patient expressed understanding and agreed to proceed. The patient and the provider were the only parties present for the visit unless noted in HPI below.  History of Present Illness: The patient is a 35 y.o. female with visit for fevers and cough and congestion. Started 3 days ago. Some body aches. Has no known sick contacts. Has not taken covid-19 testing. Denies . Overall it is worsening. Has tried nothing otc except aleve and tylenol which do help with fever. She is not currently pregnant and is having periods.   Observations/Objective: Appearance: normal, breathing appears normal, speaking in full sentences, minimal coughing during visit, casual grooming, abdomen does not appear distended, throat not well visualized, mental status is A and O times 3  Assessment and Plan: See problem oriented charting  Follow Up Instructions: rx promethazine/dm cough syrup  I discussed the assessment and treatment plan with the patient. The patient was provided an opportunity to ask questions and all were answered. The patient agreed with the plan and demonstrated an understanding of the instructions.   The patient was advised to call back or seek an in-person evaluation if the symptoms worsen or if the condition fails to improve as anticipated.  Myrlene Broker, MD

## 2020-08-27 NOTE — Assessment & Plan Note (Signed)
Is vaccinated times 2, manageable symptoms at this time. Declines at office testing today. Rx promethazine/dm cough syrup for cough. Advised zinc/vitamin C to help boost immune system. Monitor symptoms and reach back out for lack of improvement. Advised on need to quarantine and timeline.

## 2020-09-23 IMAGING — US US MFM OB FOLLOW-UP
1 series · 13 of 28 positions shown · non-contrast
Comparison: none

[Series 1: us mfm ob follow-up · 13 of 31 slices shown]
[im 2/31]
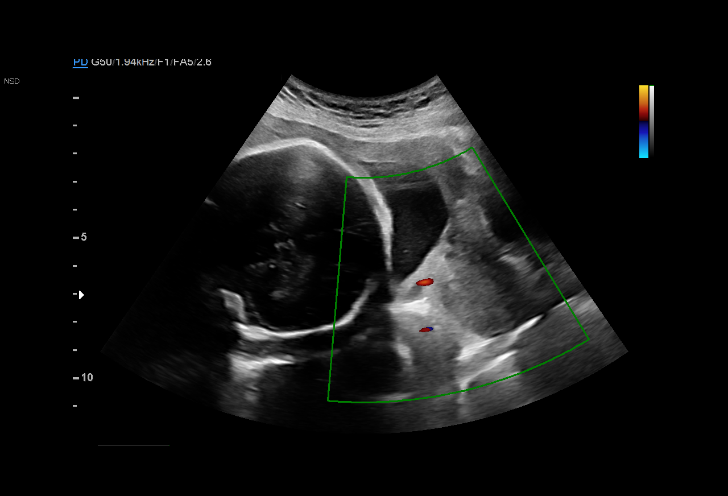
[im 4/31]
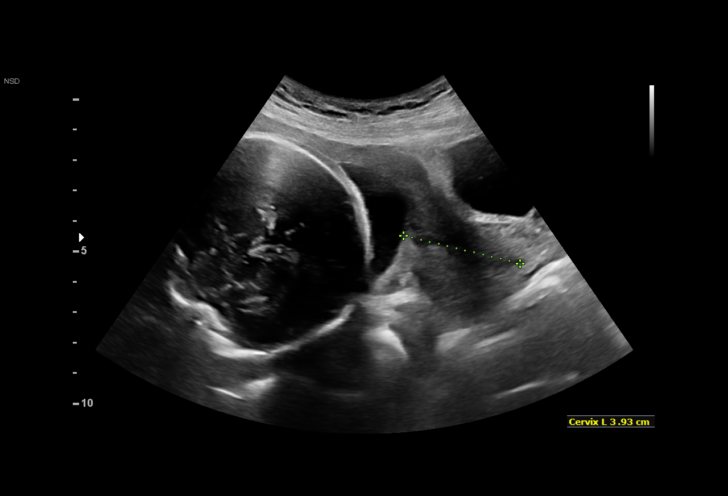
[im 6/31]
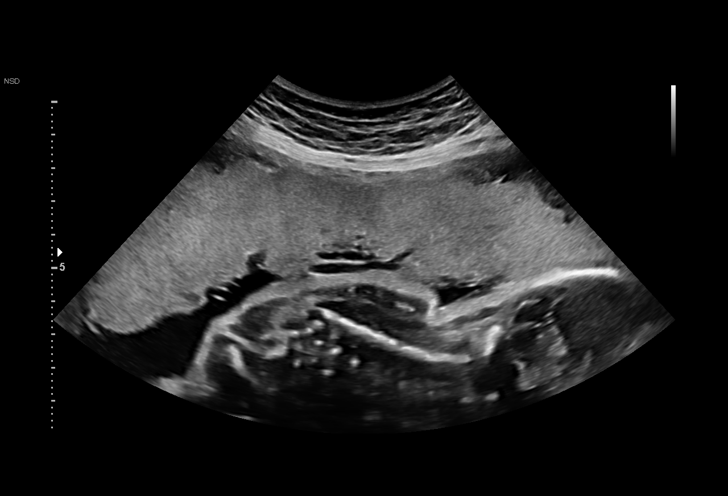
[im 8/31]
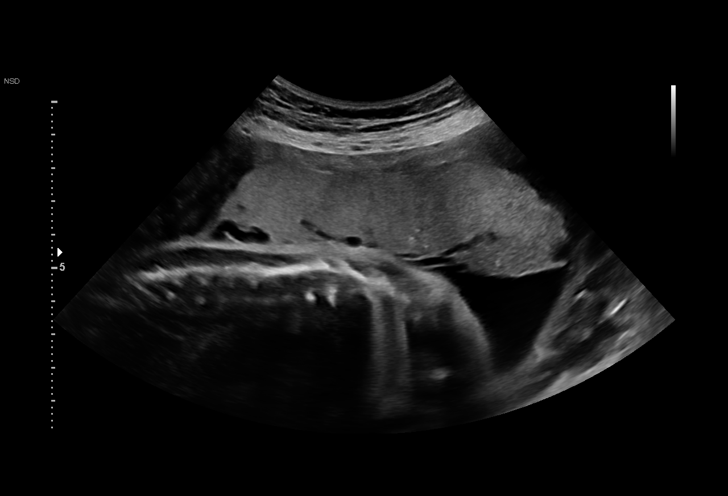
[im 11/31]
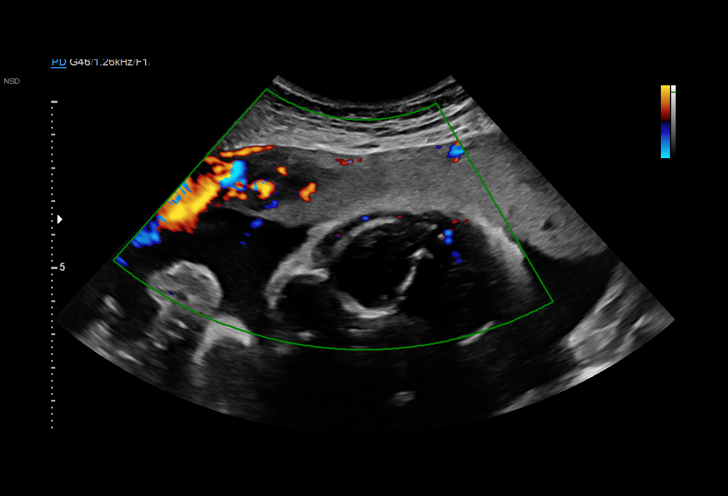
[im 13/31]
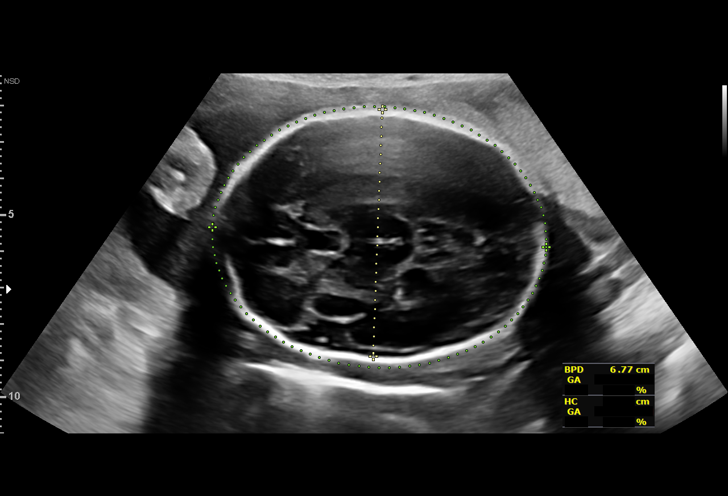
[im 16/31]
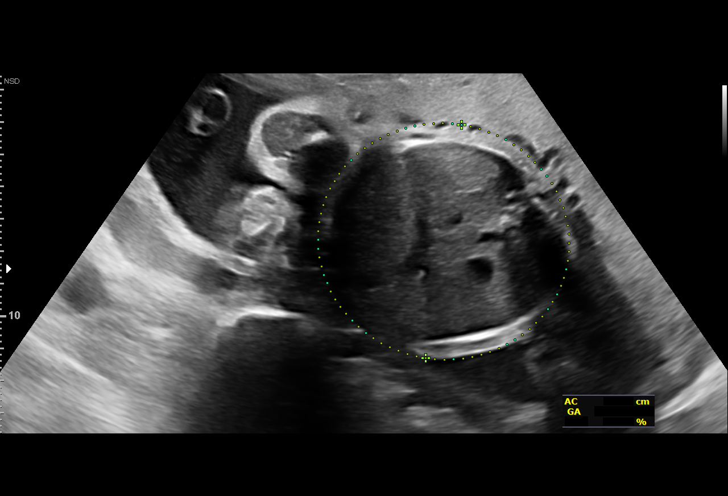
[im 18/31]
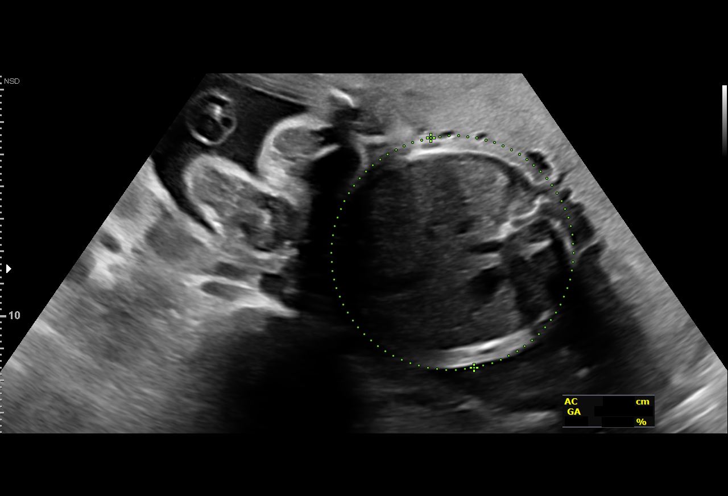
[im 21/31]
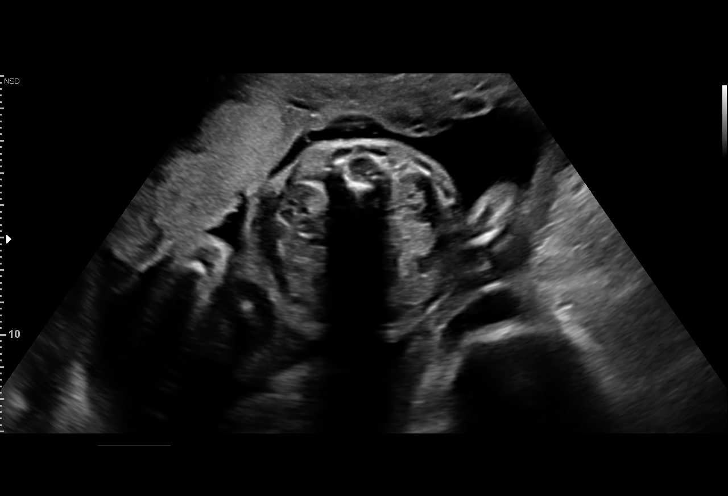
[im 23/31]
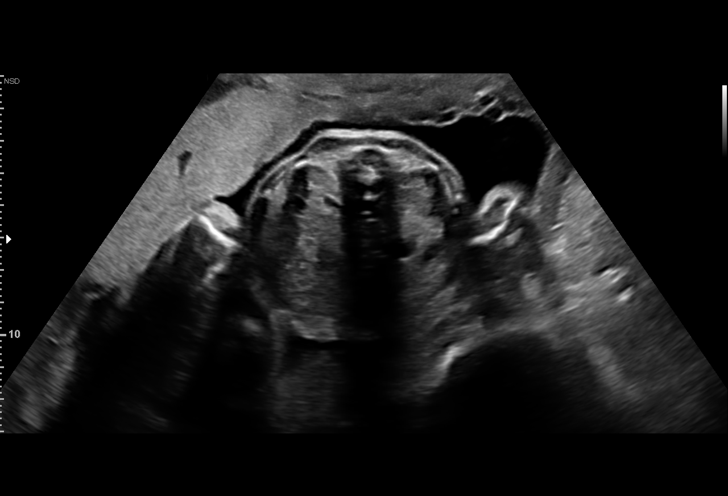
[im 25/31]
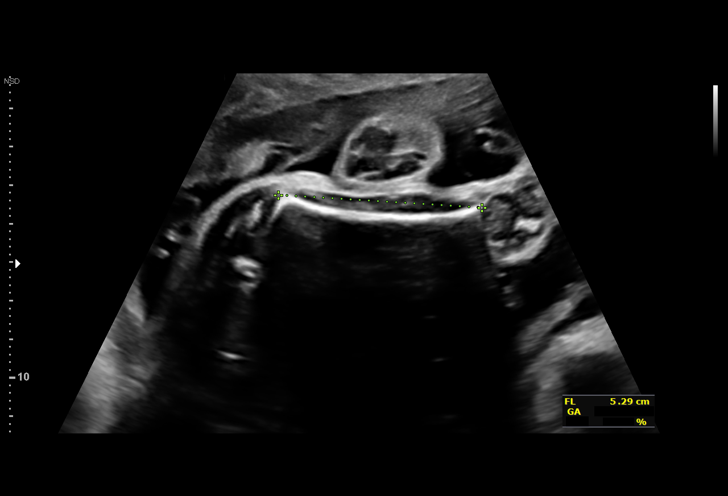
[im 27/31]
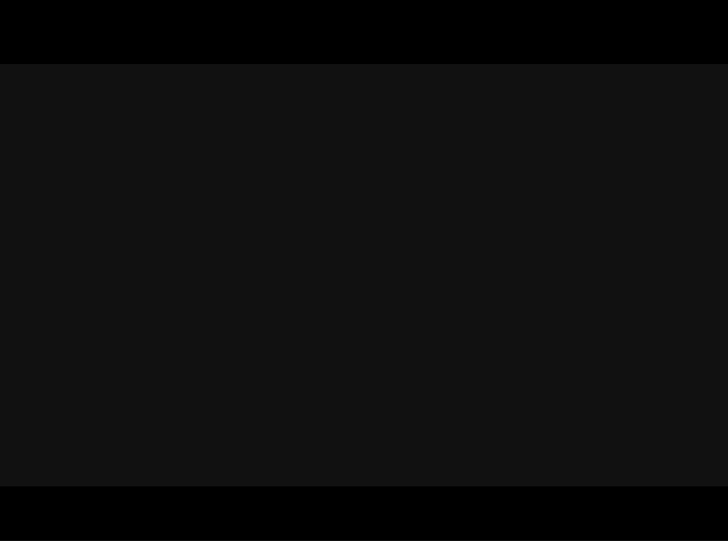
[im 29/31]
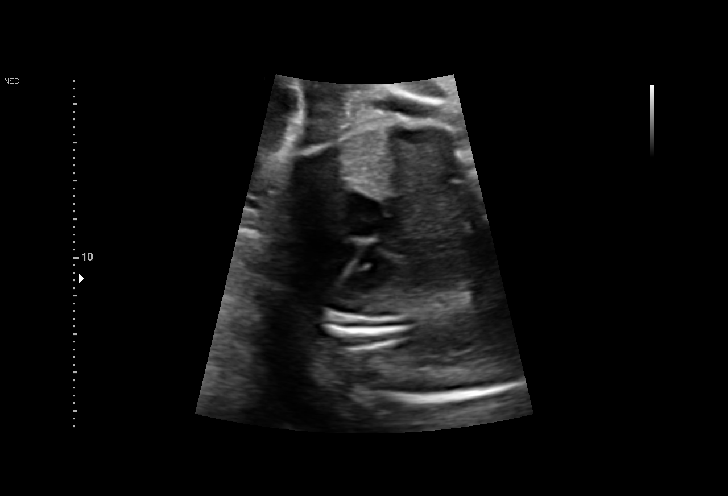

[13 of 28 positions shown; findings below may reference images not displayed]

[REDACTED]care - [REDACTED]
                   [id number]

 ----------------------------------------------------------------------

 ----------------------------------------------------------------------
Indications

  Fetal abnormality - other known or
  suspected (choroid plexus cyst, EIF) - low
  risk NIPS   (CPC resolved)
  28 weeks gestation of pregnancy
 ----------------------------------------------------------------------
Vital Signs

                                                Height:        5'4"
Fetal Evaluation

 Num Of Fetuses:         1
 Cardiac Activity:       Observed
 Presentation:           Cephalic
 Placenta:               Anterior
 P. Cord Insertion:      Previously Visualized

 Amniotic Fluid
 AFI FV:      Within normal limits

 AFI Sum(cm)     %Tile       Largest Pocket(cm)
 16.37           60

 RUQ(cm)       RLQ(cm)       LUQ(cm)        LLQ(cm)

Biometry

 BPD:      67.8  mm     G. Age:  27w 2d         14  %    CI:        69.28   %    70 - 86
                                                         FL/HC:      20.3   %    18.8 -
 HC:      260.1  mm     G. Age:  28w 2d         24  %    HC/AC:      1.12        1.05 -
 AC:      233.1  mm     G. Age:  27w 4d         28  %    FL/BPD:     77.7   %    71 - 87
 FL:       52.7  mm     G. Age:  28w 0d         32  %    FL/AC:      22.6   %    20 - 24
 LV:        1.9  mm

 Est. FW:    1181  gm      2 lb 8 oz     44  %
OB History

 Gravidity:    2         Term:   1        Prem:   0        SAB:   0
 TOP:          0       Ectopic:  0        Living: 1
Gestational Age

 Clinical EDD:  28w 1d                                        EDD:   09/17/18
 U/S Today:     27w 6d                                        EDD:   09/19/18
 Best:          28w 1d     Det. By:  Clinical EDD             EDD:   09/17/18
Anatomy

 Cranium:               Appears normal         Aortic Arch:            Previously seen
 Cavum:                 Previously seen        Ductal Arch:            Previously seen
 Ventricles:            Appears normal         Diaphragm:              Previously seen
 Choroid Plexus:        Resolved CPC           Stomach:                Appears normal, left
                                                                       sided
 Cerebellum:            Previously seen        Abdomen:                Appears normal
 Posterior Fossa:       Previously seen        Abdominal Wall:         Previously seen
 Nuchal Fold:           Previously seen        Cord Vessels:           Previously seen
 Face:                  Orbits and profile     Kidneys:                Appear normal
                        previously seen
 Lips:                  Previously seen        Bladder:                Appears normal
 Thoracic:              Appears normal         Spine:                  Previously seen
 Heart:                 Echogenic focus        Upper Extremities:      Previously seen
                        in LV
 RVOT:                  Previously seen        Lower Extremities:      Previously seen
 LVOT:                  Previously seen

 Other:  Parents do not wish to know sex of fetus. Heels and Left 5th digit
         previously visualized. Technically difficult due to fetal position.
Cervix Uterus Adnexa

 Cervix
 Length:           3.93  cm.
 Normal appearance by transabdominal scan.
Impression

 Patient returned for fetal growth assessment. Her pregnancy
 was initially dated at 11 weeks' gestation. However, we later
 learned that pregnancy was dated by gestational sac
 measurement and not CRL measurement.
 Her first ultrasound was performed at [facility] that gave the EDD as 09/17/2018. We
 recommended that her EDD be assigned at 10/18/2018. Her
 daughter weighed 7-1 at birth and her pregnancy was
 uncomplicated.
 On today's ultrasound, amniotic fluid is normal and good fetal
 activity is seen. Fetal growth is appropriate for gestational
 age. Choroid plexus cysts have resolved.
 I reassured the patient of the findings and that we would
 expect good pregnancy outcomes. Because of initial
 uncertainty in establishing gestational age, I recommend a
 follow-up fetal growth assessment in 6 weeks.
 xxxxxxxxxxxxx[email or web address]
 ADDENDUM: FINAL EDD; 09/17/2018 (September 17, 2018)
Recommendations

 -An appointment was made for her to return in 6 weeks for
 fetal growth assessment.
                      [place], [place]

## 2021-06-02 ENCOUNTER — Other Ambulatory Visit: Payer: Self-pay

## 2021-06-02 ENCOUNTER — Ambulatory Visit (INDEPENDENT_AMBULATORY_CARE_PROVIDER_SITE_OTHER): Payer: No Typology Code available for payment source

## 2021-06-02 DIAGNOSIS — Z23 Encounter for immunization: Secondary | ICD-10-CM | POA: Diagnosis not present

## 2021-06-26 NOTE — Patient Instructions (Addendum)
It was great to see you again today, I will be in touch with your labs soon as possible We will do labs to look for any evidence of rheumatoid arthritis causing your finger pain Ok to use tylenol or ibuprofen as needed for pain- voltaren gel can also be helpful!   For your hand numbness, try wearing carpal tunnel wrist braces at night for 2-3 weeks- let me know if not helpful for you   If you are thinking of becoming pregnant go ahead and start on a pre-natal vitamin beforehand!

## 2021-06-26 NOTE — Progress Notes (Addendum)
[facility] [address], [address] [place], [place] [address] [phone]  Date:  06/29/2021   Name:  Tammy Knapp   DOB:  [birth year]   MRN:  [id number]  PCP:  Pearline Cables, MD    Chief Complaint: Annual Exam (Concerns/ questions: tingling in fingers when sleeping/Flu shot today: received 06/02/21/Covid: 2 doses/Pap due today- will update today/Hep C screen due today)   History of Present Illness:  Tammy Knapp is a 35 y.o. very pleasant female patient who presents with the following:  Generally healthy young woman here today for physical exam Most recent visit with myself June 2021 She is married, currently a homemaker.  Her children are 7 and 2-both girls.  They are both doing well  She is not currently on contraception, she and her husband are thinking of having another baby.  I encouraged her to start on prenatal vitamins prior to conception COVID-19 booster- recommended  Pap smear is now due-will update today Flu shot complete Most recent labs June 21  She notes her B hands going numb at night and some issues with the joints in her fingers -especially  PIP joints of the right hand, index and long fingers No apparent injury to her hands She notes her mother did have history of arthritis but this was in her knees She is not aware of any family history of rheumatoid arthritis Patient Active Problem List   Diagnosis Date Noted   Decreased appetite 12/29/2015   Acne vulgaris 08/29/2011    Past Medical History:  Diagnosis Date   No pertinent past medical history     Past Surgical History:  Procedure Laterality Date   NO PAST SURGERIES      Social History   Tobacco Use   Smoking status: Never   Smokeless tobacco: Never  Vaping Use   Vaping Use: Never used  Substance Use Topics   Alcohol use: Not Currently   Drug use: Never    Family History  Problem Relation Age of Onset   Healthy Mother    Healthy  Father     No Known Allergies  Medication list has been reviewed and updated.  Current Outpatient Medications on File Prior to Visit  Medication Sig Dispense Refill   cholecalciferol (VITAMIN D3) 25 MCG (1000 UNIT) tablet Take 1,000 Units by mouth daily.     folic acid (FOLVITE) 1 MG tablet Take 1 mg by mouth daily.     promethazine-dextromethorphan (PROMETHAZINE-DM) 6.25-15 MG/5ML syrup Take 5 mLs by mouth 3 (three) times daily as needed for cough. 118 mL 0   No current facility-administered medications on file prior to visit.    Review of Systems:  As per HPI- otherwise negative.   Physical Examination: Vitals:   06/29/21 1017  BP: 100/60  Pulse: 83  Resp: 18  Temp: 98.2 F (36.8 C)  SpO2: 100%   Vitals:   06/29/21 1017  Weight: 130 lb 3.2 oz (59.1 kg)  Height: 5\' 4"  (1.626 m)   Body mass index is 22.35 kg/m. Ideal Body Weight: Weight in (lb) to have BMI = 25: 145.3  GEN: no acute distress.  Looks well, normal weight HEENT: Atraumatic, Normocephalic.  PEERL Ears and Nose: No external deformity. CV: RRR, No M/G/R. No JVD. No thrill. No extra heart sounds. PULM: CTA B, no wheezes, crackles, rhonchi. No retractions. No resp. distress. No accessory muscle use. ABD: S, NT, ND, +BS. No rebound. No HSM.  EXTR: No c/c/e PSYCH: Normally interactive. Conversant.  Pelvic: normal, no vaginal lesions or discharge. Uterus normal, no CMT, no adnexal tendereness or masses The right index and long fingers displays thickening and some mild loss of range of motion of the PIP joints.  Grip strength is normal.   Assessment and Plan: Physical exam  Screening for deficiency anemia - Plan: CBC  Screening for diabetes mellitus - Plan: Comprehensive metabolic panel, Hemoglobin A1c  Screening for hyperlipidemia - Plan: Lipid panel  Vitamin D deficiency - Plan: VITAMIN D 25 Hydroxy (Vit-D Deficiency, Fractures)  Screening for thyroid disorder - Plan: TSH  Screening for cervical  cancer - Plan: Cytology - PAP  Folate deficiency - Plan: Folate  Hand arthritis - Plan: Rheumatoid Factor, Sedimentation rate  Physical exam today.  Encouraged healthy diet and exercise routine.  Pap and labs are pending as above Patient has history of folate deficiency in the past, will check her level today Somewhat unusual hand arthritis for someone of her age.  Most likely osteoarthritis we will check a rheumatoid factor and sed rate Recommended that she use Tylenol or ibuprofen as needed, also can try Voltaren gel She also likely has carpal tunnel syndrome.  Recommended that she get carpal tunnel type braces and wear them at night for 2 to 3 weeks.  If not improving I have asked her to let me know  Flu shot is done  Recommend COVID booster  Signed Abbe Amsterdam, MD  Addendum 11/10, received labs as below.  Message to patient  Results for orders placed or performed in visit on 06/29/21  CBC  Result Value Ref Range   WBC 4.8 4.0 - 10.5 K/uL   RBC 3.90 3.87 - 5.11 Mil/uL   Platelets 224.0 150.0 - 400.0 K/uL   Hemoglobin 12.3 12.0 - 15.0 g/dL   HCT 35.7 01.7 - 79.3 %   MCV 95.6 78.0 - 100.0 fl   MCHC 32.9 30.0 - 36.0 g/dL   RDW 90.3 00.9 - 23.3 %  Comprehensive metabolic panel  Result Value Ref Range   Sodium 138 135 - 145 mEq/L   Potassium 4.1 3.5 - 5.1 mEq/L   Chloride 107 96 - 112 mEq/L   CO2 26 19 - 32 mEq/L   Glucose, Bld 84 70 - 99 mg/dL   BUN 16 6 - 23 mg/dL   Creatinine, Ser 0.07 0.40 - 1.20 mg/dL   Total Bilirubin 0.3 0.2 - 1.2 mg/dL   Alkaline Phosphatase 51 39 - 117 U/L   AST 12 0 - 37 U/L   ALT 9 0 - 35 U/L   Total Protein 7.3 6.0 - 8.3 g/dL   Albumin 4.3 3.5 - 5.2 g/dL   GFR 622.63 >33.54 mL/min   Calcium 9.1 8.4 - 10.5 mg/dL  Hemoglobin T6Y  Result Value Ref Range   Hgb A1c MFr Bld 5.3 4.6 - 6.5 %  Lipid panel  Result Value Ref Range   Cholesterol 159 0 - 200 mg/dL   Triglycerides 56.3 0.0 - 149.0 mg/dL   HDL 89.37 >34.28 mg/dL   VLDL 76.8 0.0  - 11.5 mg/dL   LDL Cholesterol 98 0 - 99 mg/dL   Total CHOL/HDL Ratio 3    NonHDL 111.40   TSH  Result Value Ref Range   TSH 1.98 0.35 - 5.50 uIU/mL  VITAMIN D 25 Hydroxy (Vit-D Deficiency, Fractures)  Result Value Ref Range   VITD 19.83 (L) 30.00 - 100.00 ng/mL  Folate  Result Value Ref Range  Folate 8.3 >5.9 ng/mL  Rheumatoid Factor  Result Value Ref Range   Rhuematoid fact SerPl-aCnc <14 <14 IU/mL  Sedimentation rate  Result Value Ref Range   Sed Rate 27 (H) 0 - 20 mm/hr

## 2021-06-29 ENCOUNTER — Other Ambulatory Visit (HOSPITAL_COMMUNITY)
Admission: RE | Admit: 2021-06-29 | Discharge: 2021-06-29 | Disposition: A | Discharge status: Home / Self Care | Payer: No Typology Code available for payment source | Source: Ambulatory Visit | Attending: Family Medicine | Admitting: Family Medicine

## 2021-06-29 ENCOUNTER — Ambulatory Visit (INDEPENDENT_AMBULATORY_CARE_PROVIDER_SITE_OTHER): Payer: No Typology Code available for payment source | Admitting: Family Medicine

## 2021-06-29 ENCOUNTER — Other Ambulatory Visit: Payer: Self-pay

## 2021-06-29 VITALS — BP 100/60 | HR 83 | Temp 98.2°F | Resp 18 | Ht 64.0 in | Wt 130.2 lb

## 2021-06-29 DIAGNOSIS — E559 Vitamin D deficiency, unspecified: Secondary | ICD-10-CM

## 2021-06-29 DIAGNOSIS — M19049 Primary osteoarthritis, unspecified hand: Secondary | ICD-10-CM | POA: Diagnosis not present

## 2021-06-29 DIAGNOSIS — Z131 Encounter for screening for diabetes mellitus: Secondary | ICD-10-CM

## 2021-06-29 DIAGNOSIS — Z130 Encounter for screening for diseases of the blood and blood-forming organs and certain disorders involving the immune mechanism: Secondary | ICD-10-CM | POA: Diagnosis not present

## 2021-06-29 DIAGNOSIS — Z1329 Encounter for screening for other suspected endocrine disorder: Secondary | ICD-10-CM

## 2021-06-29 DIAGNOSIS — Z0000 Encounter for general adult medical examination without abnormal findings: Secondary | ICD-10-CM | POA: Diagnosis not present

## 2021-06-29 DIAGNOSIS — E538 Deficiency of other specified B group vitamins: Secondary | ICD-10-CM

## 2021-06-29 DIAGNOSIS — R700 Elevated erythrocyte sedimentation rate: Secondary | ICD-10-CM

## 2021-06-29 DIAGNOSIS — Z124 Encounter for screening for malignant neoplasm of cervix: Secondary | ICD-10-CM

## 2021-06-29 DIAGNOSIS — Z13220 Encounter for screening for lipoid disorders: Secondary | ICD-10-CM | POA: Diagnosis not present

## 2021-06-29 LAB — COMPREHENSIVE METABOLIC PANEL
ALT: 9 U/L (ref 0–35)
AST: 12 U/L (ref 0–37)
Albumin: 4.3 g/dL (ref 3.5–5.2)
Alkaline Phosphatase: 51 U/L (ref 39–117)
BUN: 16 mg/dL (ref 6–23)
CO2: 26 mEq/L (ref 19–32)
Calcium: 9.1 mg/dL (ref 8.4–10.5)
Chloride: 107 mEq/L (ref 96–112)
Creatinine, Ser: 0.67 mg/dL (ref 0.40–1.20)
GFR: 112.31 mL/min (ref >60.00)
Glucose, Bld: 84 mg/dL (ref 70–99)
Potassium: 4.1 mEq/L (ref 3.5–5.1)
Sodium: 138 mEq/L (ref 135–145)
Total Bilirubin: 0.3 mg/dL (ref 0.2–1.2)
Total Protein: 7.3 g/dL (ref 6.0–8.3)

## 2021-06-29 LAB — LIPID PANEL
Cholesterol: 159 mg/dL (ref 0–200)
HDL: 47.5 mg/dL (ref >39.00)
LDL Cholesterol: 98 mg/dL (ref 0–99)
NonHDL: 111.4
Total CHOL/HDL Ratio: 3
Triglycerides: 68 mg/dL (ref 0.0–149.0)
VLDL: 13.6 mg/dL (ref 0.0–40.0)

## 2021-06-29 LAB — CBC
HCT: 37.3 % (ref 36.0–46.0)
Hemoglobin: 12.3 g/dL (ref 12.0–15.0)
MCHC: 32.9 g/dL (ref 30.0–36.0)
MCV: 95.6 fl (ref 78.0–100.0)
Platelets: 224 10*3/uL (ref 150.0–400.0)
RBC: 3.9 Mil/uL (ref 3.87–5.11)
RDW: 13 % (ref 11.5–15.5)
WBC: 4.8 10*3/uL (ref 4.0–10.5)

## 2021-06-29 LAB — VITAMIN D 25 HYDROXY (VIT D DEFICIENCY, FRACTURES): VITD: 19.83 ng/mL — ABNORMAL LOW (ref 30.00–100.00)

## 2021-06-29 LAB — FOLATE: Folate: 8.3 ng/mL (ref >5.9)

## 2021-06-29 LAB — HEMOGLOBIN A1C: Hgb A1c MFr Bld: 5.3 % (ref 4.6–6.5)

## 2021-06-29 LAB — TSH: TSH: 1.98 u[IU]/mL (ref 0.35–5.50)

## 2021-06-29 LAB — SEDIMENTATION RATE: Sed Rate: 27 mm/hr — ABNORMAL HIGH (ref 0–20)

## 2021-06-30 ENCOUNTER — Encounter: Payer: Self-pay | Admitting: Family Medicine

## 2021-06-30 LAB — CYTOLOGY - PAP
Adequacy: ABSENT
Comment: NEGATIVE
Diagnosis: NEGATIVE
High risk HPV: NEGATIVE

## 2021-06-30 LAB — RHEUMATOID FACTOR: Rheumatoid fact SerPl-aCnc: <14 IU/mL (ref <14)

## 2021-06-30 MED ORDER — VITAMIN D3 1.25 MG (50000 UT) PO CAPS: ORAL_CAPSULE | ORAL | 0 refills | Status: DC

## 2021-06-30 NOTE — Addendum Note (Signed)
Addended by: Abbe Amsterdam C on: 06/30/2021 06:37 AM   Modules accepted: Orders

## 2021-10-21 ENCOUNTER — Ambulatory Visit (INDEPENDENT_AMBULATORY_CARE_PROVIDER_SITE_OTHER): Payer: No Typology Code available for payment source | Admitting: Family Medicine

## 2021-10-21 ENCOUNTER — Encounter: Payer: Self-pay | Admitting: Family Medicine

## 2021-10-21 ENCOUNTER — Ambulatory Visit: Payer: No Typology Code available for payment source | Admitting: Family Medicine

## 2021-10-21 VITALS — BP 92/49 | HR 100 | Temp 98.3°F | Ht 64.0 in | Wt 131.8 lb

## 2021-10-21 DIAGNOSIS — R519 Headache, unspecified: Secondary | ICD-10-CM | POA: Diagnosis not present

## 2021-10-21 DIAGNOSIS — R059 Cough, unspecified: Secondary | ICD-10-CM | POA: Diagnosis not present

## 2021-10-21 LAB — POCT INFLUENZA A/B
Influenza A, POC: NEGATIVE
Influenza B, POC: NEGATIVE

## 2021-10-21 LAB — POC COVID19 BINAXNOW: SARS Coronavirus 2 Ag: NEGATIVE

## 2021-10-21 NOTE — Patient Instructions (Addendum)
You were negative for Flu and COVID. Likely another viral infection. Continue supportive measures including rest, hydration, humidifier use, steam showers, warm compresses to sinuses, warm liquids with lemon and honey, and over-the-counter cough, cold, and analgesics as needed.   ? ?Over the counter medications that may be helpful for symptoms: ? ?Guaifenesin 1200 mg extended release tabs twice daily, with plenty of water ?For cough and congestion ?Brand name: Mucinex   ?Pseudoephedrine 30 mg, one or two tabs every 4 to 6 hours ?For sinus congestion ?Brand name: Sudafed ?You must get this from the pharmacy counter.  ?Oxymetazoline nasal spray each morning, one spray in each nostril, for NO MORE THAN 3 days  ?For nasal and sinus congestion ?Brand name: Afrin ?Saline nasal spray or Saline Nasal Irrigation 3-5 times a day ?For nasal and sinus congestion ?Brand names: [place] or [place] ?Fluticasone nasal spray, one spray in each nostril, each morning (after oxymetazoline and saline, if used) ?For nasal and sinus congestion ?Brand name: Flonase ?Warm salt water gargles  ?For sore throat ?Every few hours as needed ?Alternate ibuprofen 400-600 mg and acetaminophen 1000 mg every 4-6 hours ?For fever, body aches, headache ?Brand names: Motrin or Advil and Tylenol ?Dextromethorphan 12-hour cough version 30 mg every 12 hours  ?For cough ?Brand name: Delsym ?Stop all other cold medications for now (Nyquil, Dayquil, Tylenol Cold, Theraflu, etc) and other non-prescription cough/cold preparations. Many of these have the same ingredients listed above and could cause an overdose of medication.  ? ?Herbal treatments that have been shown to be helpful in some patients include: ?Vitamin C 1000mg  per day ?Vitamin D 4000iU per day ?Zinc 100mg  per day ?Quercetin 25-500mg  twice a day ?Melatonin 5-10mg  at bedtime ? ?General Instructions ?Allow your body to rest ?Drink PLENTY of fluids ? ? ?Once you have recovered, please continue good preventive  care measures, including:  ?wearing a mask when in public ?wash your hands frequently ?avoid touching your face/nose/eyes ?cover coughs/sneezes with the inside of your elbow ?stay out of crowds ?keep a 6 foot distance from others ? ?If you develop severe shortness of breath, uncontrolled fevers, coughing up blood, confusion, chest pain, or signs of dehydration (such as significantly decreased urine amounts or dizziness with standing) please go to the ER. ? ?

## 2021-10-21 NOTE — Progress Notes (Signed)
? ?Acute Office Visit ? ?Subjective:  ? ? Patient ID: Tammy Knapp, female    DOB: 01-20-1986, 36 y.o.   MRN: [id number] ? ?CC: URI symptoms  ? ? ?HPI ?Patient is in today for URI symptoms.  ? ?Patient states she has been feeling poorly for the past 3 days.  ?Symptoms include: body aches, chills, sore throat (kept her from sleeping last night), dry cough, headache, rhinorrhea, extreme fatigue. She denies any sneezing, fevers, chest pain, shortness of breath, nausea, vomiting, diarrhea, ear pain. She has gotten minimal relief with ibuprofen.  ? ? ? ?Past Medical History:  ?Diagnosis Date  ? No pertinent past medical history   ? ? ?Past Surgical History:  ?Procedure Laterality Date  ? NO PAST SURGERIES    ? ? ?Family History  ?Problem Relation Age of Onset  ? Healthy Mother   ? Healthy Father   ? ? ?Social History  ? ?Socioeconomic History  ? Marital status: Married  ?  Spouse name: Not on file  ? Number of children: Not on file  ? Years of education: Not on file  ? Highest education level: Not on file  ?Occupational History  ? Not on file  ?Tobacco Use  ? Smoking status: Never  ? Smokeless tobacco: Never  ?Vaping Use  ? Vaping Use: Never used  ?Substance and Sexual Activity  ? Alcohol use: Not Currently  ? Drug use: Never  ? Sexual activity: Yes  ?  Birth control/protection: None  ?Other Topics Concern  ? Not on file  ?Social History Narrative  ? Not on file  ? ?Social Determinants of Health  ? ?Financial Resource Strain: Not on file  ?Food Insecurity: Not on file  ?Transportation Needs: Not on file  ?Physical Activity: Not on file  ?Stress: Not on file  ?Social Connections: Not on file  ?Intimate Partner Violence: Not on file  ? ? ?Outpatient Medications Prior to Visit  ?Medication Sig Dispense Refill  ? Cholecalciferol (VITAMIN D3) 1.25 MG (50000 UT) CAPS Take 1 weekly for 12 weeks 12 capsule 0  ? cholecalciferol (VITAMIN D3) 25 MCG (1000 UNIT) tablet Take 1,000 Units by mouth daily.    ? folic acid (FOLVITE) 1 MG  tablet Take 1 mg by mouth daily.    ? promethazine-dextromethorphan (PROMETHAZINE-DM) 6.25-15 MG/5ML syrup Take 5 mLs by mouth 3 (three) times daily as needed for cough. 118 mL 0  ? ?No facility-administered medications prior to visit.  ? ? ?No Known Allergies ? ?Review of Systems ?All review of systems negative except what is listed in the HPI ? ?   ?Objective:  ?  ?Physical Exam ?Vitals reviewed.  ?Constitutional:   ?   Appearance: Normal appearance. She is normal weight.  ?HENT:  ?   Head: Normocephalic and atraumatic.  ?   Right Ear: Tympanic membrane normal.  ?   Left Ear: Tympanic membrane normal.  ?   Mouth/Throat:  ?   Mouth: Mucous membranes are moist.  ?   Pharynx: Oropharynx is clear.  ?Cardiovascular:  ?   Rate and Rhythm: Normal rate and regular rhythm.  ?Pulmonary:  ?   Effort: Pulmonary effort is normal.  ?   Breath sounds: Normal breath sounds. No wheezing, rhonchi or rales.  ?Musculoskeletal:  ?   Cervical back: Normal range of motion and neck supple. No tenderness.  ?Lymphadenopathy:  ?   Cervical: No cervical adenopathy.  ?Skin: ?   General: Skin is warm and dry.  ?  Capillary Refill: Capillary refill takes less than 2 seconds.  ?Neurological:  ?   General: No focal deficit present.  ?   Mental Status: She is alert and oriented to person, place, and time. Mental status is at baseline.  ?Psychiatric:     ?   Mood and Affect: Mood normal.     ?   Behavior: Behavior normal.     ?   Thought Content: Thought content normal.     ?   Judgment: Judgment normal.  ? ? ? ? ?BP (!) 92/49   Pulse 100   Temp 98.3 ?F (36.8 ?C)   Ht 5\' 4"  (1.626 m)   Wt 131 lb 12.8 oz (59.8 kg)   SpO2 98%   BMI 22.62 kg/m?  ?Wt Readings from Last 3 Encounters:  ?10/21/21 131 lb 12.8 oz (59.8 kg)  ?06/29/21 130 lb 3.2 oz (59.1 kg)  ?02/18/20 124 lb (56.2 kg)  ? ? ?Health Maintenance Due  ?Topic Date Due  ? Hepatitis C Screening  Never done  ? COVID-19 Vaccine (3 - Booster for Pfizer series) 04/05/2020  ? ? ?There are no  preventive care reminders to display for this patient. ? ? ?Lab Results  ?Component Value Date  ? TSH 1.98 06/29/2021  ? ?Lab Results  ?Component Value Date  ? WBC 4.8 06/29/2021  ? HGB 12.3 06/29/2021  ? HCT 37.3 06/29/2021  ? MCV 95.6 06/29/2021  ? PLT 224.0 06/29/2021  ? ?Lab Results  ?Component Value Date  ? NA 138 06/29/2021  ? K 4.1 06/29/2021  ? CO2 26 06/29/2021  ? GLUCOSE 84 06/29/2021  ? BUN 16 06/29/2021  ? CREATININE 0.67 06/29/2021  ? BILITOT 0.3 06/29/2021  ? ALKPHOS 51 06/29/2021  ? AST 12 06/29/2021  ? ALT 9 06/29/2021  ? PROT 7.3 06/29/2021  ? ALBUMIN 4.3 06/29/2021  ? CALCIUM 9.1 06/29/2021  ? GFR 112.31 06/29/2021  ? ?Lab Results  ?Component Value Date  ? CHOL 159 06/29/2021  ? ?Lab Results  ?Component Value Date  ? HDL 47.50 06/29/2021  ? ?Lab Results  ?Component Value Date  ? [place] 98 06/29/2021  ? ?Lab Results  ?Component Value Date  ? TRIG 68.0 06/29/2021  ? ?Lab Results  ?Component Value Date  ? CHOLHDL 3 06/29/2021  ? ?Lab Results  ?Component Value Date  ? HGBA1C 5.3 06/29/2021  ? ? ?   ?Assessment & Plan:  ? ?1. Cough, unspecified type ?2. Acute intractable headache, unspecified headache type ?You were negative for Flu and COVID. Likely another viral infection. Continue supportive measures including rest, hydration, humidifier use, steam showers, warm compresses to sinuses, warm liquids with lemon and honey, and over-the-counter cough, cold, and analgesics as needed. Patient aware of signs/symptoms requiring further/urgent evaluation.  ? ?- POC COVID-19 BinaxNow ?- POCT Influenza A/B ? ?Please contact office for follow-up if symptoms do not improve or worsen. Seek emergency care if symptoms become severe. ? ? ?Terrilyn Saver, NP ? ?

## 2021-10-21 NOTE — Progress Notes (Signed)
3 days ?Burning in throat ?Dry cough ?Headache ?Only taking Advil ?

## 2022-05-06 NOTE — Progress Notes (Unsigned)
[facility] [address] [place], [place] [address] [phone]  Date:  05/10/2022   Name:  Tammy Knapp   DOB:  06/12/1986   MRN:  [id number]  PCP:  Darreld Mclean, MD    Chief Complaint: No chief complaint on file.   History of Present Illness:  Tammy Knapp is a 36 y.o. very pleasant female patient who presents with the following:  Generally healthy young woman here today with concern of numbness in her arms Most recent visit with myself was in November for physical She is married, 2 school-aged children She actually complained about some pain in her hands at her physical in [place] is noted to have some changes or arthritis  Patient Active Problem List   Diagnosis Date Noted   Decreased appetite 12/29/2015   Acne vulgaris 08/29/2011    Past Medical History:  Diagnosis Date   No pertinent past medical history     Past Surgical History:  Procedure Laterality Date   NO PAST SURGERIES      Social History   Tobacco Use   Smoking status: Never   Smokeless tobacco: Never  Vaping Use   Vaping Use: Never used  Substance Use Topics   Alcohol use: Not Currently   Drug use: Never    Family History  Problem Relation Age of Onset   Healthy Mother    Healthy Father     No Known Allergies  Medication list has been reviewed and updated.  No current outpatient medications on file prior to visit.   No current facility-administered medications on file prior to visit.    Review of Systems:  As per HPI- otherwise negative.   Physical Examination: There were no vitals filed for this visit. There were no vitals filed for this visit. There is no height or weight on file to calculate BMI. Ideal Body Weight:    GEN: no acute distress. HEENT: Atraumatic, Normocephalic.  Ears and Nose: No external deformity. CV: RRR, No M/G/R. No JVD. No thrill. No extra heart sounds. PULM: CTA B, no  wheezes, crackles, rhonchi. No retractions. No resp. distress. No accessory muscle use. ABD: S, NT, ND, +BS. No rebound. No HSM. EXTR: No c/c/e PSYCH: Normally interactive. Conversant.    Assessment and Plan: ***  Signed Lamar Blinks, MD

## 2022-05-10 ENCOUNTER — Encounter: Payer: Self-pay | Admitting: Family Medicine

## 2022-05-10 ENCOUNTER — Ambulatory Visit (INDEPENDENT_AMBULATORY_CARE_PROVIDER_SITE_OTHER): Payer: No Typology Code available for payment source | Admitting: Family Medicine

## 2022-05-10 VITALS — BP 98/60 | HR 65 | Temp 97.7°F | Resp 18 | Ht 64.0 in | Wt 142.2 lb

## 2022-05-10 DIAGNOSIS — Z130 Encounter for screening for diseases of the blood and blood-forming organs and certain disorders involving the immune mechanism: Secondary | ICD-10-CM | POA: Diagnosis not present

## 2022-05-10 DIAGNOSIS — E559 Vitamin D deficiency, unspecified: Secondary | ICD-10-CM

## 2022-05-10 DIAGNOSIS — Z13220 Encounter for screening for lipoid disorders: Secondary | ICD-10-CM | POA: Diagnosis not present

## 2022-05-10 DIAGNOSIS — Z131 Encounter for screening for diabetes mellitus: Secondary | ICD-10-CM

## 2022-05-10 DIAGNOSIS — R5382 Chronic fatigue, unspecified: Secondary | ICD-10-CM | POA: Diagnosis not present

## 2022-05-10 DIAGNOSIS — Z23 Encounter for immunization: Secondary | ICD-10-CM

## 2022-05-10 DIAGNOSIS — M79641 Pain in right hand: Secondary | ICD-10-CM | POA: Diagnosis not present

## 2022-05-10 DIAGNOSIS — M79642 Pain in left hand: Secondary | ICD-10-CM | POA: Diagnosis not present

## 2022-05-10 LAB — COMPREHENSIVE METABOLIC PANEL
ALT: 7 U/L (ref 0–35)
AST: 10 U/L (ref 0–37)
Albumin: 4.2 g/dL (ref 3.5–5.2)
Alkaline Phosphatase: 55 U/L (ref 39–117)
BUN: 15 mg/dL (ref 6–23)
CO2: 26 mEq/L (ref 19–32)
Calcium: 9.3 mg/dL (ref 8.4–10.5)
Chloride: 105 mEq/L (ref 96–112)
Creatinine, Ser: 0.68 mg/dL (ref 0.40–1.20)
GFR: 111.23 mL/min (ref >60.00)
Glucose, Bld: 71 mg/dL (ref 70–99)
Potassium: 4.3 mEq/L (ref 3.5–5.1)
Sodium: 138 mEq/L (ref 135–145)
Total Bilirubin: 0.3 mg/dL (ref 0.2–1.2)
Total Protein: 7.5 g/dL (ref 6.0–8.3)

## 2022-05-10 LAB — FERRITIN: Ferritin: 36.5 ng/mL (ref 10.0–291.0)

## 2022-05-10 LAB — CBC
HCT: 35.4 % — ABNORMAL LOW (ref 36.0–46.0)
Hemoglobin: 11.8 g/dL — ABNORMAL LOW (ref 12.0–15.0)
MCHC: 33.4 g/dL (ref 30.0–36.0)
MCV: 93.9 fl (ref 78.0–100.0)
Platelets: 250 10*3/uL (ref 150.0–400.0)
RBC: 3.77 Mil/uL — ABNORMAL LOW (ref 3.87–5.11)
RDW: 13 % (ref 11.5–15.5)
WBC: 5.1 10*3/uL (ref 4.0–10.5)

## 2022-05-10 LAB — HEMOGLOBIN A1C: Hgb A1c MFr Bld: 5.3 % (ref 4.6–6.5)

## 2022-05-10 LAB — B12 AND FOLATE PANEL
Folate: 12.9 ng/mL (ref >5.9)
Vitamin B-12: 423 pg/mL (ref 211–911)

## 2022-05-10 LAB — TSH: TSH: 1.87 u[IU]/mL (ref 0.35–5.50)

## 2022-05-10 LAB — LIPID PANEL
Cholesterol: 168 mg/dL (ref 0–200)
HDL: 43.8 mg/dL (ref >39.00)
LDL Cholesterol: 109 mg/dL — ABNORMAL HIGH (ref 0–99)
NonHDL: 124.06
Total CHOL/HDL Ratio: 4
Triglycerides: 77 mg/dL (ref 0.0–149.0)
VLDL: 15.4 mg/dL (ref 0.0–40.0)

## 2022-05-10 LAB — VITAMIN D 25 HYDROXY (VIT D DEFICIENCY, FRACTURES): VITD: 28.11 ng/mL — ABNORMAL LOW (ref 30.00–100.00)

## 2022-05-10 NOTE — Patient Instructions (Addendum)
Good to see you today- I will be in touch with your labs and x-ray reports asap Try wearing "carpel tunnel" splints on your wrists at night while you are asleep. You can order these online or buy in store.  Please let me know if this is not helping after about 2 weeks  I will do labs to look for any apparent cause of your fatigue  Flu shot today Recommend covid shot this fall as well   Please go to [facility] [facility] for your x-rays   [place]  [phone]

## 2022-05-11 ENCOUNTER — Ambulatory Visit
Admission: RE | Admit: 2022-05-11 | Discharge: 2022-05-11 | Disposition: A | Discharge status: Home / Self Care | Payer: No Typology Code available for payment source | Source: Ambulatory Visit | Attending: Family Medicine | Admitting: Family Medicine

## 2022-05-11 DIAGNOSIS — M79642 Pain in left hand: Secondary | ICD-10-CM

## 2022-05-12 ENCOUNTER — Encounter: Payer: Self-pay | Admitting: Family Medicine

## 2022-06-29 NOTE — Patient Instructions (Addendum)
Good to see you again today!   Try to work on building some exercise into your routine Assuming all is well please see me in about 1 years  Recommend mammogram at age 36

## 2022-06-29 NOTE — Progress Notes (Signed)
 [facility] [address] [place], [place] [address] [phone]  Date:  07/03/2022   Name:  Tammy Knapp   DOB:  09-23-1985   MRN:  [id number]  PCP:  Pearline Cables, MD    Chief Complaint: Annual Exam (Concerns/ questions: none/Hep C Screen due)   History of Present Illness:  Tammy Knapp is a 36 y.o. very pleasant female patient who presents with the following:  Pt seen today for a CPE Last seen by myself just in September with concern of numbness and pain in her bilateral arms; thought likely to be CTS She has tried splints and these did help   Married with 2 school age children; they are 8 and almost 49 yo  Pap is UTD  Mammo- no family history   Labs: Needs hep C screening at next routine labs but she already had labs in September; minimal anemia and slightly low vit D  Your blood counts look fine, minimal anemia is likely due to menstrual blood losses Metabolic profile is normal A1c shows normal average blood sugars Cholesterol is overall favorable Thyroid is normal Vitamin D is minimally low; I would suggest taking over-the-counter supplement of approximately 2000 international units daily B12, folate, iron all normal  Flu shot done  Covid booster  Never a smoker, she does not drink alcohol Admits she does no get a lot of exercise   Patient Active Problem List   Diagnosis Date Noted   Decreased appetite 12/29/2015   Acne vulgaris 08/29/2011    Past Medical History:  Diagnosis Date   No pertinent past medical history     Past Surgical History:  Procedure Laterality Date   NO PAST SURGERIES      Social History   Tobacco Use   Smoking status: Never   Smokeless tobacco: Never  Vaping Use   Vaping Use: Never used  Substance Use Topics   Alcohol use: Not Currently   Drug use: Never    Family History  Problem Relation Age of Onset   Healthy Mother    Healthy Father     No Known  Allergies  Medication list has been reviewed and updated.  No current outpatient medications on file prior to visit.   No current facility-administered medications on file prior to visit.    Review of Systems:  As per HPI- otherwise negative.   Physical Examination: Vitals:   07/03/22 0924  BP: 110/62  Pulse: 61  Resp: 18  Temp: 97.6 F (36.4 C)  SpO2: 99%   Vitals:   07/03/22 0924  Weight: 143 lb 6.4 oz (65 kg)  Height: 5\' 4"  (1.626 m)   Body mass index is 24.61 kg/m. Ideal Body Weight: Weight in (lb) to have BMI = 25: 145.3  GEN: no acute distress. HEENT: Atraumatic, Normocephalic.  Ears and Nose: No external deformity. CV: RRR, No M/G/R. No JVD. No thrill. No extra heart sounds. PULM: CTA B, no wheezes, crackles, rhonchi. No retractions. No resp. distress. No accessory muscle use. ABD: S, NT, ND, +BS. No rebound. No HSM. EXTR: No c/c/e PSYCH: Normally interactive. Conversant.    Assessment and Plan: Physical exam  Physical exam today- encouraged healthy diet and exercise routine Labs, pap, immun UTD except recommended latest covid She is not on contraception right now- considering another baby  Encouraged exercise Mammo age 38 Recheck on year   Signed 40, MD

## 2022-07-03 ENCOUNTER — Ambulatory Visit (INDEPENDENT_AMBULATORY_CARE_PROVIDER_SITE_OTHER): Payer: No Typology Code available for payment source | Admitting: Family Medicine

## 2022-07-03 VITALS — BP 110/62 | HR 61 | Temp 97.6°F | Resp 18 | Ht 64.0 in | Wt 143.4 lb

## 2022-07-03 DIAGNOSIS — Z0000 Encounter for general adult medical examination without abnormal findings: Secondary | ICD-10-CM

## 2022-11-03 NOTE — Patient Instructions (Incomplete)
Good to see you again today - I am sorry you are feeling poorly I will be in touch with your labs asap- I am hoping this will give [place] an explanation for your symptoms

## 2022-11-03 NOTE — Progress Notes (Addendum)
[facility] [address], [place], [place] [address] [phone]  Date:  11/08/2022   Name:  Tammy Knapp   DOB:  18-Feb-1986   MRN:  [id number]  PCP:  Darreld Mclean, MD    Chief Complaint: fatigue and back pain (about 2 weeks) (Pt has also been having some cramping in the R leg and R hand that are come and go)   History of Present Illness:  Tammy Knapp is a 37 y.o. very pleasant female patient who presents with the following:  Pt seen today with concern of fatigue and back pain Last seen by myself in November for her CPE   Generally in good health Married with 2 school age kids  Labs done in the fall - slight anemia and low vit D   Today she notes she is "feeling very tired all the time, my back hurts and I have cramps in my right hand and right leg,  and most of the time I have a headache" - these sx for 1 months She also notes a ST for 2 or 3 days-actually she notes she had a sore throat and maybe a fever about 2 weeks ago, this has gotten much better but she still has a mild sore throat when she swallows She notes that her entire back is hurting She feels like she is sleeping well at night  No fever or cough  She notes she has felt like this in the past and it went away on it's own  She is currently fasting for Ramaden (which started 9 days ago), but these sx preceded the start of fasting   Patient Active Problem List   Diagnosis Date Noted   Decreased appetite 12/29/2015   Acne vulgaris 08/29/2011    Past Medical History:  Diagnosis Date   No pertinent past medical history     Past Surgical History:  Procedure Laterality Date   NO PAST SURGERIES      Social History   Tobacco Use   Smoking status: Never   Smokeless tobacco: Never  Vaping Use   Vaping Use: Never used  Substance Use Topics   Alcohol use: Not Currently   Drug use: Never    Family History  Problem Relation Age of Onset    Healthy Mother    Healthy Father     No Known Allergies  Medication list has been reviewed and updated.  No current outpatient medications on file prior to visit.   No current facility-administered medications on file prior to visit.    Review of Systems:  As per HPI- otherwise negative.   Physical Examination: Vitals:   11/08/22 1331  BP: 110/60  Pulse: 79  Resp: 18  Temp: 98 F (36.7 C)  SpO2: 97%   Vitals:   11/08/22 1331  Weight: 135 lb 3.2 oz (61.3 kg)  Height: 5\' 4"  (1.626 m)   Body mass index is 23.21 kg/m. Ideal Body Weight: Weight in (lb) to have BMI = 25: 145.3  GEN: no acute distress. Looks well, normal weight HEENT: Atraumatic, Normocephalic. Bilateral TM wnl, oropharynx normal.  PEERL,EOMI.   Ears and Nose: No external deformity. CV: RRR, No M/G/R. No JVD. No thrill. No extra heart sounds. PULM: CTA B, no wheezes, crackles, rhonchi. No retractions. No resp. distress. No accessory muscle use. ABD: S, NT, ND, +BS. No rebound. No HSM. EXTR: No c/c/e PSYCH: Normally interactive. Conversant.   Wt  Readings from Last 3 Encounters:  11/08/22 135 lb 3.2 oz (61.3 kg)  07/03/22 143 lb 6.4 oz (65 kg)  05/10/22 142 lb 3.2 oz (64.5 kg)   Results for orders placed or performed in visit on 11/08/22  POCT urine pregnancy  Result Value Ref Range   Preg Test, Ur Negative Negative  POCT urinalysis dipstick  Result Value Ref Range   Color, UA yellow yellow   Clarity, UA cloudy (A) clear   Glucose, UA negative negative mg/dL   Bilirubin, UA small (A) negative   Ketones, POC UA negative negative mg/dL   Spec Grav, UA 1.015 1.010 - 1.025   Blood, UA moderate (A) negative   pH, UA 6.0 5.0 - 8.0   Protein Ur, POC trace (A) negative mg/dL   Urobilinogen, UA 0.2 0.2 or 1.0 E.U./dL   Nitrite, UA Negative Negative   Leukocytes, UA Large (3+) (A) Negative    Assessment and Plan: Fatigue, unspecified type - Plan: CBC, Comprehensive metabolic panel, POCT urine  pregnancy, POCT urinalysis dipstick, Ferritin  Leg cramps  Muscle ache - Plan: CK (Creatine Kinase), Sedimentation rate, Urine Culture  Vitamin D deficiency - Plan: VITAMIN D 25 Hydroxy (Vit-D Deficiency, Fractures)  Screening for thyroid disorder - Plan: TSH  Screening for diabetes mellitus - Plan: Hemoglobin A1c  Patient seen today with concern of fatigue and backache for about a month.  She is currently fasting for the month of Ramadan, but her symptoms preceded the start of her fast by about 2 weeks.  We will obtain blood work and urine culture as above.  There is possible blood in her urine, patient notes her menses should be starting any day which may explain this microhematuria.  I encouraged her to eat and hydrate well during nonfasting hours.  Will be in touch with her labs ASAP.  She will let me know if any changes or gets worse in the meantime  Signed Lamar Blinks, MD  Received labs as below 3/22- message to pt  Results for orders placed or performed in visit on 11/08/22  Urine Culture   Specimen: Urine  Result Value Ref Range   MICRO NUMBER: [id number]    SPECIMEN QUALITY: Adequate    Sample Source URINE    STATUS: FINAL    Result:      Mixed genital flora isolated. These superficial bacteria are not indicative of a urinary tract infection. No further organism identification is warranted on this specimen. If clinically indicated, recollect clean-catch, mid-stream urine and transfer  immediately to Urine Culture Transport Tube.   CBC  Result Value Ref Range   WBC 7.5 4.0 - 10.5 K/uL   RBC 3.77 (L) 3.87 - 5.11 Mil/uL   Platelets 310.0 150.0 - 400.0 K/uL   Hemoglobin 11.6 (L) 12.0 - 15.0 g/dL   HCT 35.0 (L) 36.0 - 46.0 %   MCV 92.7 78.0 - 100.0 fl   MCHC 33.2 30.0 - 36.0 g/dL   RDW 13.5 11.5 - 15.5 %  Comprehensive metabolic panel  Result Value Ref Range   Sodium 137 135 - 145 mEq/L   Potassium 4.2 3.5 - 5.1 mEq/L   Chloride 105 96 - 112 mEq/L   CO2 24 19 -  32 mEq/L   Glucose, Bld 86 70 - 99 mg/dL   BUN 15 6 - 23 mg/dL   Creatinine, Ser 0.74 0.40 - 1.20 mg/dL   Total Bilirubin 0.3 0.2 - 1.2 mg/dL   Alkaline Phosphatase 56 39 - 117 U/L  AST 10 0 - 37 U/L   ALT 7 0 - 35 U/L   Total Protein 7.8 6.0 - 8.3 g/dL   Albumin 4.2 3.5 - 5.2 g/dL   GFR 102.97 >60.00 mL/min   Calcium 9.5 8.4 - 10.5 mg/dL  Hemoglobin A1c  Result Value Ref Range   Hgb A1c MFr Bld 5.5 4.6 - 6.5 %  TSH  Result Value Ref Range   TSH 1.72 0.35 - 5.50 uIU/mL  CK (Creatine Kinase)  Result Value Ref Range   Total CK 30 7 - 177 U/L  Sedimentation rate  Result Value Ref Range   Sed Rate 54 (H) 0 - 20 mm/hr  VITAMIN D 25 Hydroxy (Vit-D Deficiency, Fractures)  Result Value Ref Range   VITD 20.22 (L) 30.00 - 100.00 ng/mL  Ferritin  Result Value Ref Range   Ferritin 34.3 10.0 - 291.0 ng/mL  POCT urine pregnancy  Result Value Ref Range   Preg Test, Ur Negative Negative  POCT urinalysis dipstick  Result Value Ref Range   Color, UA yellow yellow   Clarity, UA cloudy (A) clear   Glucose, UA negative negative mg/dL   Bilirubin, UA small (A) negative   Ketones, POC UA negative negative mg/dL   Spec Grav, UA 1.015 1.010 - 1.025   Blood, UA moderate (A) negative   pH, UA 6.0 5.0 - 8.0   Protein Ur, POC trace (A) negative mg/dL   Urobilinogen, UA 0.2 0.2 or 1.0 E.U./dL   Nitrite, UA Negative Negative   Leukocytes, UA Large (3+) (A) Negative

## 2022-11-08 ENCOUNTER — Ambulatory Visit (INDEPENDENT_AMBULATORY_CARE_PROVIDER_SITE_OTHER): Payer: 59 | Admitting: Family Medicine

## 2022-11-08 VITALS — BP 110/60 | HR 79 | Temp 98.0°F | Resp 18 | Ht 64.0 in | Wt 135.2 lb

## 2022-11-08 DIAGNOSIS — R700 Elevated erythrocyte sedimentation rate: Secondary | ICD-10-CM | POA: Diagnosis not present

## 2022-11-08 DIAGNOSIS — Z1329 Encounter for screening for other suspected endocrine disorder: Secondary | ICD-10-CM | POA: Diagnosis not present

## 2022-11-08 DIAGNOSIS — R252 Cramp and spasm: Secondary | ICD-10-CM

## 2022-11-08 DIAGNOSIS — E559 Vitamin D deficiency, unspecified: Secondary | ICD-10-CM

## 2022-11-08 DIAGNOSIS — R5383 Other fatigue: Secondary | ICD-10-CM | POA: Diagnosis not present

## 2022-11-08 DIAGNOSIS — M791 Myalgia, unspecified site: Secondary | ICD-10-CM | POA: Diagnosis not present

## 2022-11-08 DIAGNOSIS — Z131 Encounter for screening for diabetes mellitus: Secondary | ICD-10-CM | POA: Diagnosis not present

## 2022-11-08 LAB — POCT URINALYSIS DIP (MANUAL ENTRY)
Glucose, UA: NEGATIVE mg/dL
Ketones, POC UA: NEGATIVE mg/dL
Nitrite, UA: NEGATIVE
Spec Grav, UA: 1.015 (ref 1.010–1.025)
Urobilinogen, UA: 0.2 E.U./dL
pH, UA: 6 (ref 5.0–8.0)

## 2022-11-08 LAB — POCT URINE PREGNANCY: Preg Test, Ur: NEGATIVE

## 2022-11-09 ENCOUNTER — Encounter: Payer: Self-pay | Admitting: Family Medicine

## 2022-11-09 LAB — URINE CULTURE
MICRO NUMBER:: 14717897
SPECIMEN QUALITY:: ADEQUATE

## 2022-11-09 LAB — COMPREHENSIVE METABOLIC PANEL
ALT: 7 U/L (ref 0–35)
AST: 10 U/L (ref 0–37)
Albumin: 4.2 g/dL (ref 3.5–5.2)
Alkaline Phosphatase: 56 U/L (ref 39–117)
BUN: 15 mg/dL (ref 6–23)
CO2: 24 mEq/L (ref 19–32)
Calcium: 9.5 mg/dL (ref 8.4–10.5)
Chloride: 105 mEq/L (ref 96–112)
Creatinine, Ser: 0.74 mg/dL (ref 0.40–1.20)
GFR: 102.97 mL/min (ref >60.00)
Glucose, Bld: 86 mg/dL (ref 70–99)
Potassium: 4.2 mEq/L (ref 3.5–5.1)
Sodium: 137 mEq/L (ref 135–145)
Total Bilirubin: 0.3 mg/dL (ref 0.2–1.2)
Total Protein: 7.8 g/dL (ref 6.0–8.3)

## 2022-11-09 LAB — CBC
HCT: 35 % — ABNORMAL LOW (ref 36.0–46.0)
Hemoglobin: 11.6 g/dL — ABNORMAL LOW (ref 12.0–15.0)
MCHC: 33.2 g/dL (ref 30.0–36.0)
MCV: 92.7 fl (ref 78.0–100.0)
Platelets: 310 10*3/uL (ref 150.0–400.0)
RBC: 3.77 Mil/uL — ABNORMAL LOW (ref 3.87–5.11)
RDW: 13.5 % (ref 11.5–15.5)
WBC: 7.5 10*3/uL (ref 4.0–10.5)

## 2022-11-09 LAB — CK: Total CK: 30 U/L (ref 7–177)

## 2022-11-09 LAB — HEMOGLOBIN A1C: Hgb A1c MFr Bld: 5.5 % (ref 4.6–6.5)

## 2022-11-09 LAB — SEDIMENTATION RATE: Sed Rate: 54 mm/hr — ABNORMAL HIGH (ref 0–20)

## 2022-11-10 LAB — TSH: TSH: 1.72 u[IU]/mL (ref 0.35–5.50)

## 2022-11-10 LAB — VITAMIN D 25 HYDROXY (VIT D DEFICIENCY, FRACTURES): VITD: 20.22 ng/mL — ABNORMAL LOW (ref 30.00–100.00)

## 2022-11-10 LAB — FERRITIN: Ferritin: 34.3 ng/mL (ref 10.0–291.0)

## 2022-11-10 MED ORDER — VITAMIN D3 1.25 MG (50000 UT) PO CAPS: ORAL_CAPSULE | ORAL | 0 refills | Status: DC

## 2022-11-10 NOTE — Addendum Note (Signed)
Addended by: Darreld Mclean on: 11/10/2022 07:42 AM   Modules accepted: Orders

## 2022-11-20 ENCOUNTER — Encounter: Payer: Self-pay | Admitting: Family Medicine

## 2023-01-02 ENCOUNTER — Encounter: Payer: Self-pay | Admitting: Family Medicine

## 2023-01-03 ENCOUNTER — Other Ambulatory Visit (INDEPENDENT_AMBULATORY_CARE_PROVIDER_SITE_OTHER): Payer: 59

## 2023-01-03 DIAGNOSIS — R700 Elevated erythrocyte sedimentation rate: Secondary | ICD-10-CM

## 2023-01-03 LAB — SEDIMENTATION RATE: Sed Rate: 25 mm/hr — ABNORMAL HIGH (ref 0–20)

## 2023-01-04 ENCOUNTER — Encounter: Payer: Self-pay | Admitting: Family Medicine

## 2023-01-28 ENCOUNTER — Telehealth: Payer: 59 | Admitting: Family

## 2023-01-28 DIAGNOSIS — J029 Acute pharyngitis, unspecified: Secondary | ICD-10-CM

## 2023-01-28 MED ORDER — AMOXICILLIN 500 MG PO CAPS: 500.0000 mg | ORAL_CAPSULE | Freq: Two times a day (BID) | ORAL | 0 refills | Status: AC

## 2023-01-28 NOTE — Progress Notes (Signed)
Virtual Visit Consent   Tammy Knapp, you are scheduled for a virtual visit with a [facility] provider today. Just as with appointments in the office, your consent must be obtained to participate. Your consent will be active for this visit and any virtual visit you may have with one of our providers in the next 365 days. If you have a MyChart account, a copy of this consent can be sent to you electronically.  As this is a virtual visit, video technology does not allow for your provider to perform a traditional examination. This may limit your provider's ability to fully assess your condition. If your provider identifies any concerns that need to be evaluated in person or the need to arrange testing (such as labs, EKG, etc.), we will make arrangements to do so. Although advances in technology are sophisticated, we cannot ensure that it will always work on either your end or our end. If the connection with a video visit is poor, the visit may have to be switched to a telephone visit. With either a video or telephone visit, we are not always able to ensure that we have a secure connection.  By engaging in this virtual visit, you consent to the provision of healthcare and authorize for your insurance to be billed (if applicable) for the services provided during this visit. Depending on your insurance coverage, you may receive a charge related to this service.  I need to obtain your verbal consent now. Are you willing to proceed with your visit today? Tammy Knapp has provided verbal consent on 01/28/2023 for a virtual visit (video or telephone). Jannifer Rodney, FNP  Date: 01/28/2023 2:04 PM  Virtual Visit via Video Note   I, Jannifer Rodney, connected with  Tammy Knapp  ([id number], 1985-08-25) on 01/28/23 at  2:00 PM EDT by a video-enabled telemedicine application and verified that I am speaking with the correct person using two identifiers.  Location: Patient: Virtual Visit Location Patient:  Home Provider: Virtual Visit Location Provider: Home Office   I discussed the limitations of evaluation and management by telemedicine and the availability of in person appointments. The patient expressed understanding and agreed to proceed.    History of Present Illness: Tammy Knapp is a 37 y.o. who identifies as a female who was assigned female at birth, and is being seen today for sore throat that started four days ago.  HPI: Sore Throat  This is a new problem. The current episode started in the past 7 days. The problem has been unchanged. There has been no fever. The pain is at a severity of 6/10. The pain is mild. Associated symptoms include congestion, coughing, ear pain, headaches, a hoarse voice, swollen glands and trouble swallowing. Pertinent negatives include no ear discharge or shortness of breath. Associated symptoms comments: Myalgia . She has tried acetaminophen and NSAIDs for the symptoms. The treatment provided mild relief.    Problems:  Patient Active Problem List   Diagnosis Date Noted   Decreased appetite 12/29/2015   Acne vulgaris 08/29/2011    Allergies: No Known Allergies Medications:  Current Outpatient Medications:    amoxicillin (AMOXIL) 500 MG capsule, Take 1 capsule (500 mg total) by mouth 2 (two) times daily for 10 days., Disp: 20 capsule, Rfl: 0   Cholecalciferol (VITAMIN D3) 1.25 MG (50000 UT) capsule, Take 1 weekly for 12 weeks, Disp: 12 capsule, Rfl: 0  Observations/Objective: Patient is well-developed, well-nourished in no acute distress.  Resting comfortably  at home.  Head is normocephalic,  atraumatic.  No labored breathing.  Speech is clear and coherent with logical content.  Patient is alert and oriented at baseline.  Hoarse voice  Assessment and Plan: 1. Acute pharyngitis, unspecified etiology - amoxicillin (AMOXIL) 500 MG capsule; Take 1 capsule (500 mg total) by mouth 2 (two) times daily for 10 days.  Dispense: 20 capsule; Refill: 0  -  Take meds as prescribed - Use a cool mist humidifier  -Use saline nose sprays frequently -Force fluids -For any cough or congestion  Use plain Mucinex- regular strength or max strength is fine -For fever or aces or pains- take tylenol or ibuprofen. -Throat lozenges if help -New toothbrush in 3 days Follow up if symptoms worsen or do not improve   Follow Up Instructions: I discussed the assessment and treatment plan with the patient. The patient was provided an opportunity to ask questions and all were answered. The patient agreed with the plan and demonstrated an understanding of the instructions.  A copy of instructions were sent to the patient via MyChart unless otherwise noted below.    The patient was advised to call back or seek an in-person evaluation if the symptoms worsen or if the condition fails to improve as anticipated.  Time:  I spent 12 minutes with the patient via telehealth technology discussing the above problems/concerns.    Jannifer Rodney, FNP

## 2023-01-28 NOTE — Patient Instructions (Signed)

## 2023-03-02 ENCOUNTER — Ambulatory Visit (INDEPENDENT_AMBULATORY_CARE_PROVIDER_SITE_OTHER): Payer: 59 | Admitting: Family Medicine

## 2023-03-02 ENCOUNTER — Encounter: Payer: Self-pay | Admitting: Family Medicine

## 2023-03-02 VITALS — BP 98/70 | HR 86 | Temp 97.8°F | Resp 18 | Ht 64.0 in | Wt 131.4 lb

## 2023-03-02 DIAGNOSIS — J069 Acute upper respiratory infection, unspecified: Secondary | ICD-10-CM

## 2023-03-02 DIAGNOSIS — J029 Acute pharyngitis, unspecified: Secondary | ICD-10-CM | POA: Diagnosis not present

## 2023-03-02 LAB — POCT RAPID STREP A (OFFICE): Rapid Strep A Screen: NEGATIVE

## 2023-03-02 LAB — POC COVID19 BINAXNOW: SARS Coronavirus 2 Ag: NEGATIVE

## 2023-03-02 MED ORDER — AMOXICILLIN 875 MG PO TABS
875.0000 mg | ORAL_TABLET | Freq: Two times a day (BID) | ORAL | 0 refills | Status: AC
Start: 2023-03-02 — End: 2023-03-12

## 2023-03-02 MED ORDER — FLUTICASONE PROPIONATE 50 MCG/ACT NA SUSP: 2.0000 | Freq: Every day | NASAL | 6 refills | Status: DC

## 2023-03-02 MED ORDER — LEVOCETIRIZINE DIHYDROCHLORIDE 5 MG PO TABS
5.0000 mg | ORAL_TABLET | Freq: Every evening | ORAL | 2 refills | Status: DC
Start: 2023-03-02 — End: 2023-07-25

## 2023-03-02 NOTE — Progress Notes (Signed)
Established Patient Office Visit  Subjective   Patient ID: Tammy Knapp, female    DOB: 30-Oct-1985  Age: 37 y.o. MRN: [id number]  Chief Complaint  Patient presents with   Sore Throat    X3 days, pt states having headache, both ears, and sore throat, pain with swallowing. No fever. No COVID     HPI Discussed the use of AI scribe software for clinical note transcription with the patient, who gave verbal consent to proceed.  History of Present Illness   The patient presents with a three-day history of headache, sore throat, and bilateral ear pain. She denies fever. Over-the-counter Tylenol and Advil have been used for symptom management. The patient's spouse was recently ill with a dry cough but has since recovered. She denies any recent illness in their children.  The patient also reports a sensation of fullness in both ears, which is associated with pain. She has not reported any other symptoms or changes in their health status.      Patient Active Problem List   Diagnosis Date Noted   Acute pharyngitis 03/02/2023   Viral upper respiratory tract infection 03/02/2023   Decreased appetite 12/29/2015   Acne vulgaris 08/29/2011   Past Medical History:  Diagnosis Date   No pertinent past medical history    Past Surgical History:  Procedure Laterality Date   NO PAST SURGERIES     Social History   Tobacco Use   Smoking status: Never   Smokeless tobacco: Never  Vaping Use   Vaping status: Never Used  Substance Use Topics   Alcohol use: Not Currently   Drug use: Never   Social History   Socioeconomic History   Marital status: Married    Spouse name: Not on file   Number of children: Not on file   Years of education: Not on file   Highest education level: Not on file  Occupational History   Not on file  Tobacco Use   Smoking status: Never   Smokeless tobacco: Never  Vaping Use   Vaping status: Never Used  Substance and Sexual Activity   Alcohol use: Not  Currently   Drug use: Never   Sexual activity: Yes    Birth control/protection: None  Other Topics Concern   Not on file  Social History Narrative   Not on file   Social Determinants of Health   Financial Resource Strain: Not on file  Food Insecurity: Not on file  Transportation Needs: Not on file  Physical Activity: Not on file  Stress: Not on file  Social Connections: Not on file  Intimate Partner Violence: Not on file   Family Status  Relation Name Status   Mother  (Not Specified)   Father  (Not Specified)  No partnership data on file   Family History  Problem Relation Age of Onset   Healthy Mother    Healthy Father    No Known Allergies    Review of Systems  Constitutional:  Negative for fever and malaise/fatigue.  HENT:  Positive for congestion and sore throat.   Eyes:  Negative for blurred vision.  Respiratory:  Negative for cough and shortness of breath.   Cardiovascular:  Negative for chest pain, palpitations and leg swelling.  Gastrointestinal:  Negative for vomiting.  Musculoskeletal:  Negative for back pain.  Skin:  Negative for rash.  Neurological:  Negative for loss of consciousness and headaches.      Objective:     BP 98/70 (BP Location: Right  Arm, Patient Position: Sitting, Cuff Size: Normal)   Pulse 86   Temp 97.8 F (36.6 C) (Oral)   Resp 18   Ht 5\' 4"  (1.626 m)   Wt 131 lb 6.4 oz (59.6 kg)   SpO2 98%   BMI 22.55 kg/m  BP Readings from Last 3 Encounters:  03/02/23 98/70  11/08/22 110/60  07/03/22 110/62   Wt Readings from Last 3 Encounters:  03/02/23 131 lb 6.4 oz (59.6 kg)  11/08/22 135 lb 3.2 oz (61.3 kg)  07/03/22 143 lb 6.4 oz (65 kg)   SpO2 Readings from Last 3 Encounters:  03/02/23 98%  11/08/22 97%  07/03/22 99%      Physical Exam Vitals and nursing note reviewed.  Constitutional:      General: She is not in acute distress.    Appearance: Normal appearance. She is well-developed.  HENT:     Head: Normocephalic  and atraumatic.  Eyes:     General: No scleral icterus.       Right eye: No discharge.        Left eye: No discharge.  Cardiovascular:     Rate and Rhythm: Normal rate and regular rhythm.     Heart sounds: No murmur heard. Pulmonary:     Effort: Pulmonary effort is normal. No respiratory distress.     Breath sounds: Normal breath sounds.  Musculoskeletal:        General: Normal range of motion.     Cervical back: Normal range of motion and neck supple.     Right lower leg: No edema.     Left lower leg: No edema.  Skin:    General: Skin is warm and dry.  Neurological:     Mental Status: She is alert and oriented to person, place, and time.  Psychiatric:        Mood and Affect: Mood normal.        Behavior: Behavior normal.        Thought Content: Thought content normal.        Judgment: Judgment normal.      Results for orders placed or performed in visit on 03/02/23  POC COVID-19  Result Value Ref Range   SARS Coronavirus 2 Ag Negative Negative  POCT rapid strep A  Result Value Ref Range   Rapid Strep A Screen Negative Negative    Last CBC Lab Results  Component Value Date   WBC 7.5 11/08/2022   HGB 11.6 (L) 11/08/2022   HCT 35.0 (L) 11/08/2022   MCV 92.7 11/08/2022   MCH 31.1 08/14/2019   RDW 13.5 11/08/2022   PLT 310.0 11/08/2022   Last metabolic panel Lab Results  Component Value Date   GLUCOSE 86 11/08/2022   NA 137 11/08/2022   K 4.2 11/08/2022   CL 105 11/08/2022   CO2 24 11/08/2022   BUN 15 11/08/2022   CREATININE 0.74 11/08/2022   GFR 102.97 11/08/2022   CALCIUM 9.5 11/08/2022   PROT 7.8 11/08/2022   ALBUMIN 4.2 11/08/2022   BILITOT 0.3 11/08/2022   ALKPHOS 56 11/08/2022   AST 10 11/08/2022   ALT 7 11/08/2022   Last lipids Lab Results  Component Value Date   CHOL 168 05/10/2022   HDL 43.80 05/10/2022   LDLCALC 109 (H) 05/10/2022   TRIG 77.0 05/10/2022   CHOLHDL 4 05/10/2022   Last hemoglobin A1c Lab Results  Component Value Date    HGBA1C 5.5 11/08/2022   Last thyroid functions Lab Results  Component  Value Date   TSH 1.72 11/08/2022   Last vitamin D Lab Results  Component Value Date   VD25OH 20.22 (L) 11/08/2022   Last vitamin B12 and Folate Lab Results  Component Value Date   VITAMINB12 423 05/10/2022   FOLATE 12.9 05/10/2022      The ASCVD Risk score (Arnett DK, et al., 2019) failed to calculate for the following reasons:   The 2019 ASCVD risk score is only valid for ages 50 to 65    Assessment & Plan:   Problem List Items Addressed This Visit       Unprioritized   Viral upper respiratory tract infection   Relevant Medications   levocetirizine (XYZAL) 5 MG tablet   fluticasone (FLONASE) 50 MCG/ACT nasal spray   Acute pharyngitis - Primary    Amoxicillin 875 bid x 10 days if symptoms to not improve with antihistamine and flonase       Relevant Medications   amoxicillin (AMOXIL) 875 MG tablet   levocetirizine (XYZAL) 5 MG tablet   fluticasone (FLONASE) 50 MCG/ACT nasal spray  Assessment and Plan    Headache, Sore Throat, and Ear Pain: Symptoms for 3 days. No fever. Taking over-the-counter Tylenol and Advil. Throat and ears examined. -Perform COVID and Strep swabs. -Results expected in 5-10 minutes.  Family History: Husband recently had a dry cough but is now fine. No reported illness in children. -No immediate action required.  Follow-up: Await results of COVID and Strep tests.        No follow-ups on file.    Donato Schultz, DO

## 2023-03-02 NOTE — Patient Instructions (Signed)

## 2023-03-02 NOTE — Assessment & Plan Note (Signed)
Amoxicillin 875 bid x 10 days if symptoms to not improve with antihistamine and flonase

## 2023-04-06 ENCOUNTER — Encounter: Payer: Self-pay | Admitting: Family Medicine

## 2023-05-17 ENCOUNTER — Ambulatory Visit (INDEPENDENT_AMBULATORY_CARE_PROVIDER_SITE_OTHER): Payer: 59

## 2023-05-17 DIAGNOSIS — Z23 Encounter for immunization: Secondary | ICD-10-CM

## 2023-05-17 NOTE — Progress Notes (Signed)
Pt here for flu injection per Dr Patsy Lager  Flulaval 0.5 mL  given IM, in L Deltoid, and pt tolerated injection well.  Given VIS and Consent form.      Melton Alar, [place]  DOD: Abner Greenspan

## 2023-07-22 NOTE — Patient Instructions (Incomplete)
It was great to see you again today, I will be in touch with your blood work  Gestational age [redacted] weeks & 6 days Due date: Thursday, Mar 27, 2024 Date of conception (approximate!)  Thursday, Jul 05, 2023  Congratulations!    A few ideas for OB/GYN practices in [facility] Address: [address], [place], [place] [address] Phone: [phone]  [facility] Address: [address], [place], [place] [address] Phone: [phone]  [facility] Address: [address], [place], [place] [address] Phone: [phone]

## 2023-07-22 NOTE — Progress Notes (Unsigned)
[facility] [address] [place], [place] [address] [phone]  Date:  07/25/2023   Name:  Tammy Knapp   DOB:  09-18-1985   MRN:  [id number]  PCP:  Pearline Cables, MD    Chief Complaint: No chief complaint on file.   History of Present Illness:  Tammy Knapp is a 37 y.o. very pleasant female patient who presents with the following:  Patient seen today for physical exam Most recent visit with myself was in March of this year when she had concern of fatigue History of vitamin D deficiency Married, 2 school-aged children  There was some concern about elevated sedimentation rate earlier this year, sed rate 54 in March.  Upon recheck in May segmentation rate had improved to 25  Pap completed 11/22, negative Can update routine blood work today Patient Active Problem List   Diagnosis Date Noted   Acute pharyngitis 03/02/2023   Viral upper respiratory tract infection 03/02/2023   Decreased appetite 12/29/2015   Acne vulgaris 08/29/2011    Past Medical History:  Diagnosis Date   No pertinent past medical history     Past Surgical History:  Procedure Laterality Date   NO PAST SURGERIES      Social History   Tobacco Use   Smoking status: Never   Smokeless tobacco: Never  Vaping Use   Vaping status: Never Used  Substance Use Topics   Alcohol use: Not Currently   Drug use: Never    Family History  Problem Relation Age of Onset   Healthy Mother    Healthy Father     No Known Allergies  Medication list has been reviewed and updated.  Current Outpatient Medications on File Prior to Visit  Medication Sig Dispense Refill   Cholecalciferol (VITAMIN D3) 1.25 MG (50000 UT) capsule Take 1 weekly for 12 weeks 12 capsule 0   fluticasone (FLONASE) 50 MCG/ACT nasal spray Place 2 sprays into both nostrils daily. 16 g 6   levocetirizine (XYZAL) 5 MG tablet Take 1 tablet (5 mg total) by mouth every  evening. 30 tablet 2   No current facility-administered medications on file prior to visit.    Review of Systems:  As per HPI- otherwise negative.   Physical Examination: There were no vitals filed for this visit. There were no vitals filed for this visit. There is no height or weight on file to calculate BMI. Ideal Body Weight:    GEN: no acute distress. HEENT: Atraumatic, Normocephalic.  Ears and Nose: No external deformity. CV: RRR, No M/G/R. No JVD. No thrill. No extra heart sounds. PULM: CTA B, no wheezes, crackles, rhonchi. No retractions. No resp. distress. No accessory muscle use. ABD: S, NT, ND, +BS. No rebound. No HSM. EXTR: No c/c/e PSYCH: Normally interactive. Conversant.    Assessment and Plan: *** Physical exam today.  Encouraged healthy diet and exercise routine Will plan further follow- up pending labs.  Signed Abbe Amsterdam, MD

## 2023-07-25 ENCOUNTER — Ambulatory Visit (INDEPENDENT_AMBULATORY_CARE_PROVIDER_SITE_OTHER): Payer: 59 | Admitting: Family Medicine

## 2023-07-25 ENCOUNTER — Encounter: Payer: Self-pay | Admitting: Family Medicine

## 2023-07-25 VITALS — BP 102/60 | HR 60 | Temp 98.0°F | Resp 18 | Ht 64.0 in | Wt 137.0 lb

## 2023-07-25 DIAGNOSIS — Z130 Encounter for screening for diseases of the blood and blood-forming organs and certain disorders involving the immune mechanism: Secondary | ICD-10-CM | POA: Diagnosis not present

## 2023-07-25 DIAGNOSIS — E559 Vitamin D deficiency, unspecified: Secondary | ICD-10-CM | POA: Diagnosis not present

## 2023-07-25 DIAGNOSIS — Z13220 Encounter for screening for lipoid disorders: Secondary | ICD-10-CM

## 2023-07-25 DIAGNOSIS — Z0000 Encounter for general adult medical examination without abnormal findings: Secondary | ICD-10-CM

## 2023-07-25 DIAGNOSIS — Z131 Encounter for screening for diabetes mellitus: Secondary | ICD-10-CM | POA: Diagnosis not present

## 2023-07-25 DIAGNOSIS — Z1329 Encounter for screening for other suspected endocrine disorder: Secondary | ICD-10-CM

## 2023-07-26 ENCOUNTER — Encounter: Payer: Self-pay | Admitting: Family Medicine

## 2023-07-26 DIAGNOSIS — Z3A01 Less than 8 weeks gestation of pregnancy: Secondary | ICD-10-CM

## 2023-07-26 LAB — LIPID PANEL
Cholesterol: 167 mg/dL (ref 0–200)
HDL: 42.9 mg/dL (ref >39.00)
LDL Cholesterol: 103 mg/dL — ABNORMAL HIGH (ref 0–99)
NonHDL: 124.05
Total CHOL/HDL Ratio: 4
Triglycerides: 103 mg/dL (ref 0.0–149.0)
VLDL: 20.6 mg/dL (ref 0.0–40.0)

## 2023-07-26 LAB — COMPREHENSIVE METABOLIC PANEL
ALT: 10 U/L (ref 0–35)
AST: 12 U/L (ref 0–37)
Albumin: 4.5 g/dL (ref 3.5–5.2)
Alkaline Phosphatase: 56 U/L (ref 39–117)
BUN: 15 mg/dL (ref 6–23)
CO2: 25 meq/L (ref 19–32)
Calcium: 9.8 mg/dL (ref 8.4–10.5)
Chloride: 103 meq/L (ref 96–112)
Creatinine, Ser: 0.63 mg/dL (ref 0.40–1.20)
GFR: 112.34 mL/min (ref >60.00)
Glucose, Bld: 78 mg/dL (ref 70–99)
Potassium: 4.5 meq/L (ref 3.5–5.1)
Sodium: 136 meq/L (ref 135–145)
Total Bilirubin: 0.3 mg/dL (ref 0.2–1.2)
Total Protein: 7.7 g/dL (ref 6.0–8.3)

## 2023-07-26 LAB — CBC
HCT: 36.9 % (ref 36.0–46.0)
Hemoglobin: 12.5 g/dL (ref 12.0–15.0)
MCHC: 33.8 g/dL (ref 30.0–36.0)
MCV: 95 fL (ref 78.0–100.0)
Platelets: 295 10*3/uL (ref 150.0–400.0)
RBC: 3.89 Mil/uL (ref 3.87–5.11)
RDW: 13.6 % (ref 11.5–15.5)
WBC: 7.3 10*3/uL (ref 4.0–10.5)

## 2023-07-26 LAB — TSH: TSH: 2.5 u[IU]/mL (ref 0.35–5.50)

## 2023-07-26 LAB — VITAMIN D 25 HYDROXY (VIT D DEFICIENCY, FRACTURES): VITD: 33.47 ng/mL (ref 30.00–100.00)

## 2023-07-26 LAB — HEMOGLOBIN A1C: Hgb A1c MFr Bld: 5.3 % (ref 4.6–6.5)

## 2023-08-06 NOTE — Addendum Note (Signed)
Addended by: Abbe Amsterdam C on: 08/06/2023 12:06 PM   Modules accepted: Orders

## 2023-08-14 MED ORDER — DOXYLAMINE-PYRIDOXINE 10-10 MG PO TBEC: DELAYED_RELEASE_TABLET | ORAL | 1 refills | Status: DC

## 2023-08-14 NOTE — Addendum Note (Signed)
Addended by: Abbe Amsterdam C on: 08/14/2023 11:51 AM   Modules accepted: Orders

## 2023-08-22 NOTE — L&D Delivery Note (Signed)
 OB/GYN Faculty Practice Delivery Note  Tammy Knapp is a 38 y.o. [id number] s/p NVD at [redacted]w[redacted]d. She was admitted for SOL.   ROM: 2h 85m with clear fluid GBS Status:  Negative/-- (07/23 1700) Maximum Maternal Temperature: 98.55F  Labor Progress: Initial SVE: 5/60/-2. She then progressed to complete.   Delivery Date/Time: 03/22/2024 @1336  Delivery: Called to room and patient was complete and pushing. Head delivered ROA. Shoulder and body delivered through tight nuchal x1. Infant with spontaneous cry, placed on mother's abdomen, dried and stimulated. Cord clamped x 2 after 1-minute delay, and cut by Dr. Janita. Cord blood drawn. Placenta delivered spontaneously with gentle cord traction. Fundus firm with massage and Pitocin . Labia, perineum, vagina, and cervix inspected with 2nd degree perineal repair in usual fashion.  Baby Weight: pending  Placenta: 3 vessel, intact. Sent to L&D Complications: None Lacerations: 2nd degree perineum EBL: 168 mL Analgesia: Epidural   Infant:  APGAR (1 MIN):   APGAR (5 MINS): 9   Barabara GORMAN Maier, DO OB Family Medicine Fellow, [facility] for [facility], [facility] 03/22/2024, 2:07 PM

## 2023-09-13 DIAGNOSIS — Z3480 Encounter for supervision of other normal pregnancy, unspecified trimester: Secondary | ICD-10-CM | POA: Diagnosis not present

## 2023-09-13 DIAGNOSIS — Z3201 Encounter for pregnancy test, result positive: Secondary | ICD-10-CM | POA: Diagnosis not present

## 2023-09-13 DIAGNOSIS — N925 Other specified irregular menstruation: Secondary | ICD-10-CM | POA: Diagnosis not present

## 2023-09-17 LAB — OB RESULTS CONSOLE RUBELLA ANTIBODY, IGM: Rubella: IMMUNE

## 2023-09-17 LAB — HEPATITIS C ANTIBODY: HCV Ab: NEGATIVE

## 2023-09-17 LAB — OB RESULTS CONSOLE HEPATITIS B SURFACE ANTIGEN: Hepatitis B Surface Ag: NEGATIVE

## 2023-09-17 LAB — OB RESULTS CONSOLE HIV ANTIBODY (ROUTINE TESTING): HIV: NONREACTIVE

## 2023-09-17 LAB — OB RESULTS CONSOLE RPR: RPR: NONREACTIVE

## 2023-10-11 DIAGNOSIS — Z369 Encounter for antenatal screening, unspecified: Secondary | ICD-10-CM | POA: Diagnosis not present

## 2023-10-11 DIAGNOSIS — Z124 Encounter for screening for malignant neoplasm of cervix: Secondary | ICD-10-CM | POA: Diagnosis not present

## 2023-10-11 DIAGNOSIS — Z113 Encounter for screening for infections with a predominantly sexual mode of transmission: Secondary | ICD-10-CM | POA: Diagnosis not present

## 2023-11-01 DIAGNOSIS — Z363 Encounter for antenatal screening for malformations: Secondary | ICD-10-CM | POA: Diagnosis not present

## 2023-11-01 DIAGNOSIS — Z3A19 19 weeks gestation of pregnancy: Secondary | ICD-10-CM | POA: Diagnosis not present

## 2023-11-13 ENCOUNTER — Ambulatory Visit: Payer: Self-pay | Admitting: Family Medicine

## 2023-11-13 ENCOUNTER — Encounter: Payer: Self-pay | Admitting: Family Medicine

## 2023-11-13 ENCOUNTER — Ambulatory Visit: Admitting: Family Medicine

## 2023-11-13 VITALS — BP 126/80 | HR 111 | Temp 99.3°F | Ht 64.0 in | Wt 157.0 lb

## 2023-11-13 DIAGNOSIS — R6889 Other general symptoms and signs: Secondary | ICD-10-CM

## 2023-11-13 NOTE — Progress Notes (Signed)
 Chief Complaint  Patient presents with   Acute Visit    Patient presents today for cough, body aches, fever, chills.    Tammy Knapp here for URI complaints.  Duration: 1 day  Associated symptoms: subjective fever, sinus headache, rhinorrhea, sore throat, myalgia, and coughing Denies: sinus congestion, sinus pain, itchy watery eyes, ear pain, ear drainage, wheezing, shortness of breath, and V/D Treatment to date: home remedies Sick contacts: Yes; daughter dx'w flu  Past Medical History:  Diagnosis Date   No pertinent past medical history     Objective BP 126/80   Pulse (!) 111   Temp 99.3 F (37.4 C)   Ht 5\' 4"  (1.626 m)   Wt 157 lb (71.2 kg)   SpO2 96%   BMI 26.95 kg/m  General: Awake, alert, appears stated age HEENT: AT, [place], ears patent b/l and TM's neg, nares patent w/o discharge, pharynx pink and without exudates, MMM,  no sinus ttp Neck: No masses or asymmetry Heart: Reg rhythm, tachycardic.  Lungs: CTAB, no accessory muscle use Psych: Age appropriate judgment and insight, normal mood and affect  Flu-like symptoms  Offered Tamiflu, politely declined. Honey, air humidifiers, lemon tea rec'd. She is pregnant and would prefer not to take any PO meds. Tylenol prn. Continue to push fluids, practice good hand hygiene, cover mouth when coughing. F/u prn. If starting to experience worsening s/s's, shaking, or shortness of breath, seek immediate care. Pt voiced understanding and agreement to the plan.  Jilda Roche Benoit, DO 11/13/23 1:32 PM

## 2023-11-13 NOTE — Telephone Encounter (Signed)
 Influenza exposure and pt [redacted] weeks pregnant  Symptoms: Burning sensation in throat and nose, dry cough, pain in both legs/back, fever (pt not sure temp), chills, headache, chest pain with cough Frequency: Started yesterday Pertinent Negatives: Patient denies difficulty breathing  Disposition:  [x] Appointment(In office)  Additional Notes: Pt states she is [redacted] weeks pregnant. Pt states her daughter tested positive for the flu yesterday. Pt scheduled for an appt today at PCP office with a different provider. This RN educated pt on home care, new-worsening symptoms, when to call back/seek emergent care. Pt verbalized understanding and agrees to plan.    Copied from CRM [id number]. Topic: Clinical - Red Word Triage >> Nov 13, 2023  9:46 AM Drema Balzarine wrote: Red Word that prompted transfer to Nurse Triage: Patient has burning sore throat, fever, pain in legs and body Reason for Disposition  [1] Influenza EXPOSURE (Close Contact) within last 48 hours (2 days) AND [2] exposed person is HIGH RISK (e.g., age > 64 years, pregnant, HIV+, chronic medical condition)  Answer Assessment - Initial Assessment Questions Influenza exposure and pt [redacted] weeks pregnant  Onset: started feeling bad yesterday  Daughter tested positive for flu yesterday and symptoms began Sunday  Symptoms: Burning sensation in throat and nose, dry cough, pain in both legs/back, fever (pt not sure temp), chills, headache, chest pain with cough Frequency: Started yesterday  Pertinent Negatives: Patient denies difficulty breathing  Protocols used: Influenza (Flu) Exposure-A-AH

## 2023-11-13 NOTE — Patient Instructions (Addendum)
 Continue to push fluids, practice good hand hygiene, and cover your mouth if you cough.  If you start having fevers, shaking or shortness of breath, seek immediate care.  OK to take Tylenol 1000 mg (2 extra strength tabs) or 975 mg (3 regular strength tabs) every 6 hours as needed.  No NSAIDs for now.   OK to use Debrox (peroxide) in the ear to loosen up wax. Also recommend using a bulb syringe (for removing boogers from baby's noses) to flush through warm water and vinegar (3-4:1 ratio). An alternative, though more expensive, is an elephant ear washer wax removal kit. Do not use Q-tips as this can impact wax further.  Let us know if you need anything.

## 2023-11-22 ENCOUNTER — Other Ambulatory Visit: Payer: Self-pay

## 2023-11-22 ENCOUNTER — Ambulatory Visit: Admitting: Obstetrics and Gynecology

## 2023-11-22 ENCOUNTER — Encounter: Payer: Self-pay | Admitting: Obstetrics and Gynecology

## 2023-11-22 VITALS — BP 113/74 | HR 104 | Wt 154.4 lb

## 2023-11-22 DIAGNOSIS — O09529 Supervision of elderly multigravida, unspecified trimester: Secondary | ICD-10-CM | POA: Insufficient documentation

## 2023-11-22 DIAGNOSIS — O0992 Supervision of high risk pregnancy, unspecified, second trimester: Secondary | ICD-10-CM | POA: Diagnosis not present

## 2023-11-22 DIAGNOSIS — Z3A22 22 weeks gestation of pregnancy: Secondary | ICD-10-CM

## 2023-11-22 DIAGNOSIS — O09522 Supervision of elderly multigravida, second trimester: Secondary | ICD-10-CM | POA: Diagnosis not present

## 2023-11-22 DIAGNOSIS — O0990 Supervision of high risk pregnancy, unspecified, unspecified trimester: Secondary | ICD-10-CM | POA: Insufficient documentation

## 2023-11-22 DIAGNOSIS — Z3490 Encounter for supervision of normal pregnancy, unspecified, unspecified trimester: Secondary | ICD-10-CM | POA: Insufficient documentation

## 2023-11-22 NOTE — Progress Notes (Signed)
 INITIAL PRENATAL VISIT  Subjective:   Tammy Knapp is being seen today for her first obstetrical visit.    She is at [redacted]w[redacted]d gestation by LMP  Her obstetrical history is significant for advanced maternal age. Relationship with FOB: spouse, living together. Patient  unsure  intend to breast feed. Pregnancy history fully reviewed.  Patient reports no complaints.  Indications for ASA therapy (per uptodate) Two or more of the following: Nulliparity No Obesity (body mass index >30 kg/m2) No Family history of preeclampsia in mother or sister No Age >=35 years Yes Sociodemographic characteristics (African American race, low socioeconomic level) No Personal risk factors (eg, previous pregnancy with low birth weight or small for gestational age infant, previous adverse pregnancy outcome [eg, stillbirth], interval >10 years between pregnancies) No   Objective:    Obstetric History OB History  Gravida Para Term Preterm AB Living  3 2 2   2   SAB IAB Ectopic Multiple Live Births     0 2    # Outcome Date GA Lbr Len/2nd Weight Sex Type Anes PTL Lv  3 Current           2 Term 09/11/18 [redacted]w[redacted]d 14:02 / 00:37 7 lb 5.8 oz (3.34 kg) F Vag-Spont EPI  LIV  1 Term 06/10/14 [redacted]w[redacted]d  7 lb 9 oz (3.43 kg) F Vag-Spont   LIV    Past Medical History:  Diagnosis Date   No pertinent past medical history     Past Surgical History:  Procedure Laterality Date   NO PAST SURGERIES      Current Outpatient Medications on File Prior to Visit  Medication Sig Dispense Refill   Prenatal Vit-Fe Fumarate-FA (MULTIVITAMIN-PRENATAL) 27-0.8 MG TABS tablet Take 1 tablet by mouth daily at 12 noon.     No current facility-administered medications on file prior to visit.    No Known Allergies  Social History:  reports that she has never smoked. She has never used smokeless tobacco. She reports that she does not currently use alcohol. She reports that she does not use drugs.  Family History  Problem Relation Age of  Onset   Healthy Mother    Healthy Father     The following portions of the patient's history were reviewed and updated as appropriate: allergies, current medications, past family history, past medical history, past social history, past surgical history and problem list.  Review of Systems Review of Systems  All other systems reviewed and are negative.    Physical Exam:  BP 113/74   Pulse (!) 104   Wt 154 lb 6.4 oz (70 kg)   LMP 06/21/2023   BMI 26.50 kg/m  CONSTITUTIONAL: Well-developed, well-nourished female in no acute distress.  HENT:  Normocephalic, atraumatic.   EYES: Conjunctivae normal. NECK: Normal range of motion SKIN: Skin is warm and dry. MUSCULOSKELETAL: Normal range of motion NEUROLOGIC: Alert and oriented  PSYCHIATRIC: Normal mood and affect. Normal behavior.  CARDIOVASCULAR: Normal heart rate noted RESPIRATORY: normal effort ABDOMEN: Soft PELVIC:deferred  Fetal Heart Rate (bpm): 152   Movement: Present       Assessment:    Pregnancy: G3P2002  1. Supervision of high risk pregnancy, antepartum (Primary) BP and FHR normal Doing well, feeling regular movement  FH appropriate Declines genetic testing   2. Multigravida of advanced maternal age in second trimester - [place] MFM OB DETAIL +14 WK; Future     Plan:     Initial labs drawn. Prenatal vitamins. Problem list reviewed and updated. Reviewed in  detail the nature of the practice with collaborative care between  Genetic screening discussed: NIPS/First trimester screen/Quad/AFP results reviewed. Role of ultrasound in pregnancy discussed; Anatomy US: ordered. Follow up in 4 weeks. Discussed clinic routines, schedule of care and testing, genetic screening options, involvement of students and residents under the direct supervision of APPs and doctors and presence of female providers. Pt verbalized understanding.  Return in 4 weeks for routine prenatal  Future Appointments  Date Time Provider Department  Center  12/18/2023  8:00 AM [facility] NURSE INTAKE [facility]  12/20/2023  8:50 AM [facility] LAB [facility]  12/20/2023 10:15 AM Reva Bores, MD [facility]  12/20/2023  1:00 PM [facility] PROVIDER 1 [facility]  12/20/2023  1:30 PM [facility] US3 [facility]     Sue Lush, FNP

## 2023-11-25 ENCOUNTER — Encounter: Payer: Self-pay | Admitting: Family Medicine

## 2023-11-26 ENCOUNTER — Other Ambulatory Visit: Payer: Self-pay | Admitting: Family Medicine

## 2023-11-26 MED ORDER — AMOXICILLIN 875 MG PO TABS: 875.0000 mg | ORAL_TABLET | Freq: Two times a day (BID) | ORAL | 0 refills | Status: DC

## 2023-11-30 ENCOUNTER — Encounter: Payer: Self-pay | Admitting: Family Medicine

## 2023-11-30 ENCOUNTER — Ambulatory Visit: Admitting: Family Medicine

## 2023-11-30 VITALS — BP 108/68 | HR 105 | Temp 98.1°F | Resp 12 | Ht 64.0 in | Wt 156.4 lb

## 2023-11-30 DIAGNOSIS — J0140 Acute pansinusitis, unspecified: Secondary | ICD-10-CM | POA: Diagnosis not present

## 2023-11-30 MED ORDER — IPRATROPIUM BROMIDE 0.06 % NA SOLN: 2.0000 | Freq: Four times a day (QID) | NASAL | 12 refills | Status: DC

## 2023-11-30 MED ORDER — AMOXICILLIN 875 MG PO TABS: 875.0000 mg | ORAL_TABLET | Freq: Two times a day (BID) | ORAL | 0 refills | Status: AC

## 2023-11-30 NOTE — Progress Notes (Signed)
 Established Patient Office Visit  Subjective   Patient ID: Tammy Knapp, female    DOB: [birth year]  Age: 38 y.o. MRN: [id number]  Chief Complaint  Patient presents with   heavy mucus and congestion, headache coughing, sneezing    HPI Discussed the use of AI scribe software for clinical note transcription with the patient, who gave verbal consent to proceed.  History of Present Illness Tammy Knapp is a 38 year old female who presents with persistent upper respiratory symptoms for three weeks. She is accompanied by her husband. She was referred by Dr. Abron Abt for evaluation of persistent flu-like symptoms.  She has been experiencing persistent upper respiratory symptoms for the past three weeks, including fever, cough, congestion, and thick mucus production. The mucus is described as yellow and sometimes mixed with blood. She also experiences severe headaches, which are exacerbated by coughing.  She was previously evaluated by Dr. Abron Abt, who suspected a viral infection such as flu, COVID-19, or RSV, but no specific tests were conducted. Her daughter was diagnosed with the flu, and it is suspected that she may have contracted the same virus.  She has not taken the prescribed amoxicillin due to concerns about its safety during pregnancy. She has been using over-the-counter nasal sprays and other remedies, which provided temporary relief initially but have not been effective in the long term. She has also tried steam inhalation with Vicks.  She reports significant discomfort and fatigue due to her symptoms, which have impacted her ability to sleep. No ear pain, but she mentions generalized body pain.  Her husband and two daughters were also sick, but have since recovered.     Patient Active Problem List   Diagnosis Date Noted   Acute non-recurrent pansinusitis 12/02/2023   AMA (advanced maternal age) multigravida 35+ 11/22/2023   Supervision of high risk pregnancy, antepartum  11/22/2023   Decreased appetite 12/29/2015   Acne vulgaris 08/29/2011   Past Medical History:  Diagnosis Date   No pertinent past medical history    Past Surgical History:  Procedure Laterality Date   NO PAST SURGERIES     Social History   Tobacco Use   Smoking status: Never   Smokeless tobacco: Never  Vaping Use   Vaping status: Never Used  Substance Use Topics   Alcohol use: Not Currently   Drug use: Never   Social History   Socioeconomic History   Marital status: Married    Spouse name: Not on file   Number of children: Not on file   Years of education: Not on file   Highest education level: Not on file  Occupational History   Not on file  Tobacco Use   Smoking status: Never   Smokeless tobacco: Never  Vaping Use   Vaping status: Never Used  Substance and Sexual Activity   Alcohol use: Not Currently   Drug use: Never   Sexual activity: Yes    Birth control/protection: None  Other Topics Concern   Not on file  Social History Narrative   Not on file   Social Drivers of Health   Financial Resource Strain: Not on file  Food Insecurity: Not on file  Transportation Needs: Not on file  Physical Activity: Not on file  Stress: Not on file  Social Connections: Not on file  Intimate Partner Violence: Not on file   Family Status  Relation Name Status   Mother  (Not Specified)   Father  (Not Specified)  No partnership data on file  Family History  Problem Relation Age of Onset   Healthy Mother    Healthy Father    No Known Allergies    Review of Systems  Constitutional:  Negative for fever and malaise/fatigue.  HENT:  Positive for congestion, sinus pain and sore throat.   Eyes:  Negative for blurred vision.  Respiratory:  Positive for cough and sputum production. Negative for shortness of breath.   Cardiovascular:  Negative for chest pain, palpitations and leg swelling.  Gastrointestinal:  Negative for vomiting.  Musculoskeletal:  Negative for  back pain.  Skin:  Negative for rash.  Neurological:  Negative for loss of consciousness and headaches.      Objective:     BP 108/68 (BP Location: Right Arm, Patient Position: Sitting, Cuff Size: Normal)   Pulse (!) 105   Temp 98.1 F (36.7 C) (Oral)   Resp 12   Ht 5\' 4"  (1.626 m)   Wt 156 lb 6.4 oz (70.9 kg)   LMP 06/21/2023   SpO2 97%   BMI 26.85 kg/m  BP Readings from Last 3 Encounters:  11/30/23 108/68  11/22/23 113/74  11/13/23 126/80   Wt Readings from Last 3 Encounters:  11/30/23 156 lb 6.4 oz (70.9 kg)  11/22/23 154 lb 6.4 oz (70 kg)  11/13/23 157 lb (71.2 kg)   SpO2 Readings from Last 3 Encounters:  11/30/23 97%  11/13/23 96%  07/25/23 99%      Physical Exam Vitals and nursing note reviewed.  Constitutional:      General: She is not in acute distress.    Appearance: Normal appearance. She is well-developed.  HENT:     Head: Normocephalic and atraumatic.     Right Ear: Tympanic membrane, ear canal and external ear normal. There is no impacted cerumen.     Left Ear: Tympanic membrane, ear canal and external ear normal. There is no impacted cerumen.     Nose: Mucosal edema present.     Right Turbinates: Swollen. Not enlarged.     Left Turbinates: Swollen.     Right Sinus: Maxillary sinus tenderness and frontal sinus tenderness present.     Left Sinus: Maxillary sinus tenderness and frontal sinus tenderness present.     Mouth/Throat:     Mouth: Mucous membranes are moist.     Pharynx: Oropharynx is clear. No oropharyngeal exudate or posterior oropharyngeal erythema.  Eyes:     General: No scleral icterus.       Right eye: No discharge.        Left eye: No discharge.     Conjunctiva/sclera: Conjunctivae normal.     Pupils: Pupils are equal, round, and reactive to light.  Neck:     Thyroid: No thyromegaly or thyroid tenderness.     Vascular: No JVD.  Cardiovascular:     Rate and Rhythm: Normal rate and regular rhythm.     Heart sounds: Normal heart  sounds. No murmur heard. Pulmonary:     Effort: Pulmonary effort is normal. No respiratory distress.     Breath sounds: Normal breath sounds.  Abdominal:     General: Bowel sounds are normal. There is no distension.     Palpations: Abdomen is soft. There is no mass.     Tenderness: There is no abdominal tenderness. There is no guarding or rebound.  Genitourinary:    Vagina: Normal.  Musculoskeletal:        General: Normal range of motion.     Cervical back: Normal range of motion  and neck supple.     Right lower leg: No edema.     Left lower leg: No edema.  Lymphadenopathy:     Cervical: No cervical adenopathy.  Skin:    General: Skin is warm and dry.     Findings: No erythema or rash.  Neurological:     Mental Status: She is alert and oriented to person, place, and time.     Cranial Nerves: No cranial nerve deficit.     Deep Tendon Reflexes: Reflexes are normal and symmetric.  Psychiatric:        Mood and Affect: Mood normal.        Behavior: Behavior normal.        Thought Content: Thought content normal.        Judgment: Judgment normal.     No results found for any visits on 11/30/23.  Last CBC Lab Results  Component Value Date   WBC 7.3 07/25/2023   HGB 12.5 07/25/2023   HCT 36.9 07/25/2023   MCV 95.0 07/25/2023   MCH 31.1 08/14/2019   RDW 13.6 07/25/2023   PLT 295.0 07/25/2023   Last metabolic panel Lab Results  Component Value Date   GLUCOSE 78 07/25/2023   NA 136 07/25/2023   K 4.5 07/25/2023   CL 103 07/25/2023   CO2 25 07/25/2023   BUN 15 07/25/2023   CREATININE 0.63 07/25/2023   GFR 112.34 07/25/2023   CALCIUM 9.8 07/25/2023   PROT 7.7 07/25/2023   ALBUMIN 4.5 07/25/2023   BILITOT 0.3 07/25/2023   ALKPHOS 56 07/25/2023   AST 12 07/25/2023   ALT 10 07/25/2023   Last lipids Lab Results  Component Value Date   CHOL 167 07/25/2023   HDL 42.90 07/25/2023   LDLCALC 103 (H) 07/25/2023   TRIG 103.0 07/25/2023   CHOLHDL 4 07/25/2023   Last  hemoglobin A1c Lab Results  Component Value Date   HGBA1C 5.3 07/25/2023   Last thyroid functions Lab Results  Component Value Date   TSH 2.50 07/25/2023   Last vitamin D Lab Results  Component Value Date   VD25OH 33.47 07/25/2023   Last vitamin B12 and Folate Lab Results  Component Value Date   VITAMINB12 423 05/10/2022   FOLATE 12.9 05/10/2022      The ASCVD Risk score (Arnett DK, et al., 2019) failed to calculate for the following reasons:   The 2019 ASCVD risk score is only valid for ages 66 to 74    Assessment & Plan:   Problem List Items Addressed This Visit       Unprioritized   Acute non-recurrent pansinusitis - Primary   Antibiotics per orders-- amoxicillin Atrovent nasal spray  Return to office as needed        Relevant Medications   ipratropium (ATROVENT) 0.06 % nasal spray   amoxicillin (AMOXIL) 875 MG tablet   Assessment and Plan Assessment & Plan Sinusitis   Symptoms for three weeks include fever, cough, congestion, thick yellow mucus, and headaches, consistent with sinusitis, likely secondary to a viral infection, possibly influenza, as her daughter was diagnosed with the flu. Pregnancy limits treatment options. Amoxicillin, safe during pregnancy, can treat bacterial sinusitis. She was initially hesitant about antibiotics due to pregnancy safety concerns. The safety of amoxicillin during pregnancy was confirmed. Alternative treatments such as nasal sprays, Mucinex, and steam inhalation were discussed for symptom management. Prescribe amoxicillin for 10 days, twice daily. Prescribe a nasal spray safe during pregnancy. Recommend Mucinex as needed to thin mucus  and aid expectoration. Suggest using a neti pot or similar device to help drain the sinuses. Advise steam inhalation with Vicks for symptomatic relief.   Return if symptoms worsen or fail to improve.    Masson Nalepa R Lowne Chase, DO

## 2023-11-30 NOTE — Telephone Encounter (Signed)
 Patient and patient's husband called front office regarding patient flu-like symptoms. Patient's husband stated that she has not had a confirmed test of respiratory illness. Advised patient to be test by PCP, urgent care or MAU. Safe medications in pregnancy sent to patient via Mychart. Reviewed MAU precautions with patient and address sent via Mychart. Advised patient to contact our office for any further questions or concerns.   Marcelino Duster, RN

## 2023-12-02 ENCOUNTER — Encounter: Payer: Self-pay | Admitting: Family Medicine

## 2023-12-02 DIAGNOSIS — J0140 Acute pansinusitis, unspecified: Secondary | ICD-10-CM

## 2023-12-02 HISTORY — DX: Acute pansinusitis, unspecified: J01.40

## 2023-12-02 NOTE — Patient Instructions (Signed)

## 2023-12-02 NOTE — Assessment & Plan Note (Signed)
 Antibiotics per orders-- amoxicillin Atrovent nasal spray  Return to office as needed

## 2023-12-18 ENCOUNTER — Ambulatory Visit

## 2023-12-19 ENCOUNTER — Other Ambulatory Visit: Payer: Self-pay

## 2023-12-19 DIAGNOSIS — O0990 Supervision of high risk pregnancy, unspecified, unspecified trimester: Secondary | ICD-10-CM

## 2023-12-19 DIAGNOSIS — Z3A26 26 weeks gestation of pregnancy: Secondary | ICD-10-CM

## 2023-12-20 ENCOUNTER — Other Ambulatory Visit: Payer: Self-pay | Admitting: *Deleted

## 2023-12-20 ENCOUNTER — Ambulatory Visit: Attending: Obstetrics and Gynecology

## 2023-12-20 ENCOUNTER — Other Ambulatory Visit: Payer: Self-pay

## 2023-12-20 ENCOUNTER — Ambulatory Visit: Payer: Self-pay | Admitting: Family Medicine

## 2023-12-20 ENCOUNTER — Ambulatory Visit: Admitting: Maternal & Fetal Medicine

## 2023-12-20 ENCOUNTER — Other Ambulatory Visit

## 2023-12-20 VITALS — BP 106/71 | HR 92 | Wt 163.2 lb

## 2023-12-20 VITALS — BP 103/58 | HR 88

## 2023-12-20 DIAGNOSIS — O09522 Supervision of elderly multigravida, second trimester: Secondary | ICD-10-CM

## 2023-12-20 DIAGNOSIS — Z23 Encounter for immunization: Secondary | ICD-10-CM | POA: Diagnosis not present

## 2023-12-20 DIAGNOSIS — O0990 Supervision of high risk pregnancy, unspecified, unspecified trimester: Secondary | ICD-10-CM

## 2023-12-20 DIAGNOSIS — Z3A26 26 weeks gestation of pregnancy: Secondary | ICD-10-CM | POA: Insufficient documentation

## 2023-12-20 DIAGNOSIS — O365990 Maternal care for other known or suspected poor fetal growth, unspecified trimester, not applicable or unspecified: Secondary | ICD-10-CM | POA: Diagnosis not present

## 2023-12-20 DIAGNOSIS — O365920 Maternal care for other known or suspected poor fetal growth, second trimester, not applicable or unspecified: Secondary | ICD-10-CM

## 2023-12-20 DIAGNOSIS — O0992 Supervision of high risk pregnancy, unspecified, second trimester: Secondary | ICD-10-CM

## 2023-12-20 NOTE — Progress Notes (Signed)
   PRENATAL VISIT NOTE  Subjective:  Tammy Knapp is a 38 y.o. G3P2002 at [redacted]w[redacted]d being seen today for ongoing prenatal care.  She is currently monitored for the following issues for this high-risk pregnancy and has Acne vulgaris; Decreased appetite; AMA (advanced maternal age) multigravida 35+; Supervision of high risk pregnancy, antepartum; and Acute non-recurrent pansinusitis on their problem list.  Patient reports no complaints.  Contractions: Not present. Vag. Bleeding: None.  Movement: Present. Denies leaking of fluid.   The following portions of the patient's history were reviewed and updated as appropriate: allergies, current medications, past family history, past medical history, past social history, past surgical history and problem list.   Objective:   Vitals:   12/20/23 1037  BP: 106/71  Pulse: 92  Weight: 163 lb 3.2 oz (74 kg)    Fetal Status: Fetal Heart Rate (bpm): 151 Fundal Height: 26 cm Movement: Present     General:  Alert, oriented and cooperative. Patient is in no acute distress.  Skin: Skin is warm and dry. No rash noted.   Cardiovascular: Normal heart rate noted  Respiratory: Normal respiratory effort, no problems with respiration noted  Abdomen: Soft, gravid, appropriate for gestational age.  Pain/Pressure: Absent     Pelvic: Cervical exam deferred        Extremities: Normal range of motion.  Edema: None  Mental Status: Normal mood and affect. Normal behavior. Normal judgment and thought content.   Assessment and Plan:  Pregnancy: G3P2002 at [redacted]w[redacted]d 1. [redacted] weeks gestation of pregnancy (Primary) 28 week labs today TDaP today  2. Supervision of high risk pregnancy, antepartum - Tdap vaccine greater than or equal to 7yo IM  3. Multigravida of advanced maternal age in second trimester Declined genetics  Preterm labor symptoms and general obstetric precautions including but not limited to vaginal bleeding, contractions, leaking of fluid and fetal movement were  reviewed in detail with the patient. Please refer to After Visit Summary for other counseling recommendations.   Return in 2 weeks (on 01/03/2024).  Future Appointments  Date Time Provider Department Center  01/07/2024  1:35 PM Marcelino Sera [facility]  01/15/2024  1:35 PM Jan Mcgill, MD [facility]    Granville Layer, MD

## 2023-12-20 NOTE — Progress Notes (Signed)
 MFM Consultation  Ms. Tammy Knapp is a 38 yo G3P2 at 23 w 0 d with an EDD of 03/27/24 She is seen for a new late to care detailed exam at the request of Bobbette Burns, MD  Today we observed fetal growth restriction with an EFW of 8% with AC 7%. The UAD were normal without evidence of AEDF or REDF. There was good fetal movement and normal amniotic fluid volume.  Suboptimal views of the fetal anatomy was obtained secondary to fetal position.   MS. Tammy Knapp  pregnancy is otherwise uncomplicated.  She declined NIPT.   Impression/Counseling.   I discussed today's visit concerning the diagnosis of IUGR.   I explained that the etiology includes placental insufficiency, chronic disease, infection, aneuploidy and other genetic syndromes. She has no additional risk factors for chronic disease.   At this time I explained the diagnosis, evaluation and management to include on serial fetal growth and weekly antenatal testing to include UA Dopplers.    Delivery should commence at between 38-39 weeks. Delivery should occur at 37 weeks if the EFW/AC is < 3% or abnormal UAD are noted.   All questions answered  I spent 30 minutes with > 50% in face to face consultation and care coordination.  Tonya Fredrickson, MD

## 2023-12-21 ENCOUNTER — Encounter: Payer: Self-pay | Admitting: Family Medicine

## 2023-12-21 LAB — CBC
Hematocrit: 31.4 % — ABNORMAL LOW (ref 34.0–46.6)
Hemoglobin: 10 g/dL — ABNORMAL LOW (ref 11.1–15.9)
MCH: 31.3 pg (ref 26.6–33.0)
MCHC: 31.8 g/dL (ref 31.5–35.7)
MCV: 98 fL — ABNORMAL HIGH (ref 79–97)
Platelets: 212 10*3/uL (ref 150–450)
RBC: 3.2 x10E6/uL — ABNORMAL LOW (ref 3.77–5.28)
RDW: 12.4 % (ref 11.7–15.4)
WBC: 7.3 10*3/uL (ref 3.4–10.8)

## 2023-12-21 LAB — GLUCOSE TOLERANCE, 2 HOURS W/ 1HR
Glucose, 1 hour: 109 mg/dL (ref 70–179)
Glucose, 2 hour: 104 mg/dL (ref 70–152)
Glucose, Fasting: 77 mg/dL (ref 70–91)

## 2023-12-21 LAB — HIV ANTIBODY (ROUTINE TESTING W REFLEX): HIV Screen 4th Generation wRfx: NONREACTIVE

## 2023-12-21 LAB — RPR: RPR Ser Ql: NONREACTIVE

## 2024-01-02 ENCOUNTER — Other Ambulatory Visit: Payer: Self-pay | Admitting: *Deleted

## 2024-01-02 ENCOUNTER — Ambulatory Visit (HOSPITAL_BASED_OUTPATIENT_CLINIC_OR_DEPARTMENT_OTHER)

## 2024-01-02 ENCOUNTER — Ambulatory Visit: Attending: Obstetrics and Gynecology | Admitting: Obstetrics

## 2024-01-02 ENCOUNTER — Ambulatory Visit (HOSPITAL_BASED_OUTPATIENT_CLINIC_OR_DEPARTMENT_OTHER): Admitting: *Deleted

## 2024-01-02 VITALS — BP 115/72 | HR 95

## 2024-01-02 DIAGNOSIS — O365920 Maternal care for other known or suspected poor fetal growth, second trimester, not applicable or unspecified: Secondary | ICD-10-CM | POA: Insufficient documentation

## 2024-01-02 DIAGNOSIS — O365990 Maternal care for other known or suspected poor fetal growth, unspecified trimester, not applicable or unspecified: Secondary | ICD-10-CM

## 2024-01-02 DIAGNOSIS — O09522 Supervision of elderly multigravida, second trimester: Secondary | ICD-10-CM | POA: Insufficient documentation

## 2024-01-02 DIAGNOSIS — Z3A27 27 weeks gestation of pregnancy: Secondary | ICD-10-CM

## 2024-01-02 DIAGNOSIS — O0990 Supervision of high risk pregnancy, unspecified, unspecified trimester: Secondary | ICD-10-CM

## 2024-01-02 DIAGNOSIS — O365939 Maternal care for other known or suspected poor fetal growth, third trimester, other fetus: Secondary | ICD-10-CM

## 2024-01-02 NOTE — Progress Notes (Signed)
 MFM Consult Note  Tammy Knapp is currently at 27 weeks and 6 days.  She has been followed due to advanced maternal age (38 years old) and IUGR.  She denies any problems since her last exam and reports feeling fetal movements throughout the day.  She had a reactive NST for her gestational age today.    There was normal amniotic fluid noted with a total AFI of 13.12 cm.  Doppler studies of the umbilical arteries performed due to fetal growth restriction showed a normal S/D ratio of 3.25.  There were no signs of absent or reversed end-diastolic flow noted today.  The patient was reassured that IUGR is a common finding.  Most cases of IUGR result in the delivery of a healthy infant at or close to term.    She was advised to continue to monitor fetal movements on a daily basis.  Due to IUGR, she will return in 1 week for another NST and umbilical artery Doppler study.  We will reassess the fetal growth in 2 weeks.  The patient and her husband stated that all of their questions were answered today.  A total of 20 minutes was spent counseling and coordinating the care for this patient.  Greater than 50% of the time was spent in direct face-to-face contact.

## 2024-01-02 NOTE — Procedures (Signed)
 Tammy Knapp [birth year] [redacted]w[redacted]d  Fetus A Non-Stress Test Interpretation for 01/02/24- NSY with Limited and UAD  Indication: IUGR and Advanced Maternal Age >40 years  Fetal Heart Rate A Mode: External Baseline Rate (A): 145 bpm Variability: Moderate Accelerations: 15 x 15 Decelerations: None Multiple birth?: No  Uterine Activity Mode: Toco Contraction Frequency (min): none Resting Tone Palpated: Relaxed  Interpretation (Fetal Testing) Nonstress Test Interpretation: Reactive Comments: Tracing reviewed byDr. Grayland Le

## 2024-01-07 ENCOUNTER — Other Ambulatory Visit: Payer: Self-pay

## 2024-01-07 ENCOUNTER — Ambulatory Visit: Admitting: Advanced Practice Midwife

## 2024-01-07 VITALS — BP 99/66 | HR 89 | Wt 171.0 lb

## 2024-01-07 DIAGNOSIS — O0993 Supervision of high risk pregnancy, unspecified, third trimester: Secondary | ICD-10-CM | POA: Diagnosis not present

## 2024-01-07 DIAGNOSIS — O365930 Maternal care for other known or suspected poor fetal growth, third trimester, not applicable or unspecified: Secondary | ICD-10-CM | POA: Diagnosis not present

## 2024-01-07 DIAGNOSIS — Z3A28 28 weeks gestation of pregnancy: Secondary | ICD-10-CM

## 2024-01-07 DIAGNOSIS — O0990 Supervision of high risk pregnancy, unspecified, unspecified trimester: Secondary | ICD-10-CM

## 2024-01-07 DIAGNOSIS — O365990 Maternal care for other known or suspected poor fetal growth, unspecified trimester, not applicable or unspecified: Secondary | ICD-10-CM

## 2024-01-07 DIAGNOSIS — O09523 Supervision of elderly multigravida, third trimester: Secondary | ICD-10-CM

## 2024-01-07 DIAGNOSIS — O09522 Supervision of elderly multigravida, second trimester: Secondary | ICD-10-CM

## 2024-01-07 NOTE — Progress Notes (Signed)
   PRENATAL VISIT NOTE  Subjective:  Terrace Minshall is a 38 y.o. G3P2002 at [redacted]w[redacted]d being seen today for ongoing prenatal care.  She is currently monitored for the following issues for this low-risk pregnancy and has Acne vulgaris; Decreased appetite; AMA (advanced maternal age) multigravida 35+; Supervision of high risk pregnancy, antepartum; Acute non-recurrent pansinusitis; and Fetal growth restriction antepartum on their problem list.  Patient reports no complaints.  Contractions: Not present. Vag. Bleeding: None.  Movement: Present. Denies leaking of fluid.   The following portions of the patient's history were reviewed and updated as appropriate: allergies, current medications, past family history, past medical history, past social history, past surgical history and problem list.   Objective:    Vitals:   01/07/24 1330  BP: 99/66  Pulse: 89  Weight: 171 lb (77.6 kg)    Fetal Status:  Fetal Heart Rate (bpm): 150 Fundal Height: 28 cm Movement: Present    General: Alert, oriented and cooperative. Patient is in no acute distress.  Skin: Skin is warm and dry. No rash noted.   Cardiovascular: Normal heart rate noted  Respiratory: Normal respiratory effort, no problems with respiration noted  Abdomen: Soft, gravid, appropriate for gestational age.  Pain/Pressure: Absent     Pelvic: Cervical exam deferred        Extremities: Normal range of motion.  Edema: None  Mental Status: Normal mood and affect. Normal behavior. Normal judgment and thought content.   Assessment and Plan:  Pregnancy: G3P2002 at [redacted]w[redacted]d 1. Supervision of high risk pregnancy, antepartum --Anticipatory guidance about next visits/weeks of pregnancy given.   2. [redacted] weeks gestation of pregnancy (Primary)   3. Multigravida of advanced maternal age in second trimester   4. Fetal growth restriction antepartum --Reviewed US  with EFW 8% with patient.  Pt with hyperemesis in early pregnancy but is now eating better.  Encouraged high protein diet if tolerated.   --Pt with hx FGR on US  that resolved with 7 lb baby at delivery --Will continue to monitor with MFM US /dopplers  Preterm labor symptoms and general obstetric precautions including but not limited to vaginal bleeding, contractions, leaking of fluid and fetal movement were reviewed in detail with the patient. Please refer to After Visit Summary for other counseling recommendations.   Return in about 2 weeks (around 01/21/2024) for HROB.  Future Appointments  Date Time Provider Department Center  01/21/2024  4:15 PM Zelma Hidden, FNP [facility]  01/31/2024  9:00 AM [facility] PROVIDER 1 [facility]  01/31/2024  9:30 AM [facility] US5 [facility]  01/31/2024  1:15 PM [facility] NST [facility]    Arlester Bence, CNM

## 2024-01-15 ENCOUNTER — Encounter: Admitting: Obstetrics and Gynecology

## 2024-01-16 ENCOUNTER — Ambulatory Visit (HOSPITAL_BASED_OUTPATIENT_CLINIC_OR_DEPARTMENT_OTHER)

## 2024-01-16 ENCOUNTER — Other Ambulatory Visit: Payer: Self-pay | Admitting: Obstetrics

## 2024-01-16 ENCOUNTER — Encounter: Payer: Self-pay | Admitting: Obstetrics

## 2024-01-16 ENCOUNTER — Ambulatory Visit: Attending: Maternal & Fetal Medicine | Admitting: Obstetrics and Gynecology

## 2024-01-16 ENCOUNTER — Ambulatory Visit

## 2024-01-16 ENCOUNTER — Other Ambulatory Visit: Payer: Self-pay | Admitting: *Deleted

## 2024-01-16 VITALS — BP 114/62 | HR 87

## 2024-01-16 DIAGNOSIS — Z3A29 29 weeks gestation of pregnancy: Secondary | ICD-10-CM | POA: Diagnosis not present

## 2024-01-16 DIAGNOSIS — O365930 Maternal care for other known or suspected poor fetal growth, third trimester, not applicable or unspecified: Secondary | ICD-10-CM

## 2024-01-16 DIAGNOSIS — O09523 Supervision of elderly multigravida, third trimester: Secondary | ICD-10-CM

## 2024-01-16 DIAGNOSIS — O365990 Maternal care for other known or suspected poor fetal growth, unspecified trimester, not applicable or unspecified: Secondary | ICD-10-CM

## 2024-01-16 DIAGNOSIS — Z3689 Encounter for other specified antenatal screening: Secondary | ICD-10-CM

## 2024-01-16 DIAGNOSIS — O0990 Supervision of high risk pregnancy, unspecified, unspecified trimester: Secondary | ICD-10-CM

## 2024-01-16 NOTE — Progress Notes (Signed)
 After review, MFM consult with provider is not indicated for today  Tammy Clever, MD 01/16/2024 10:12 AM  [facility]

## 2024-01-21 ENCOUNTER — Other Ambulatory Visit: Payer: Self-pay

## 2024-01-21 ENCOUNTER — Ambulatory Visit: Admitting: Obstetrics and Gynecology

## 2024-01-21 VITALS — BP 111/74 | HR 116 | Temp 99.0°F | Wt 176.6 lb

## 2024-01-21 DIAGNOSIS — O365930 Maternal care for other known or suspected poor fetal growth, third trimester, not applicable or unspecified: Secondary | ICD-10-CM | POA: Diagnosis not present

## 2024-01-21 DIAGNOSIS — Z3A30 30 weeks gestation of pregnancy: Secondary | ICD-10-CM

## 2024-01-21 DIAGNOSIS — O365990 Maternal care for other known or suspected poor fetal growth, unspecified trimester, not applicable or unspecified: Secondary | ICD-10-CM

## 2024-01-21 DIAGNOSIS — O09522 Supervision of elderly multigravida, second trimester: Secondary | ICD-10-CM

## 2024-01-21 DIAGNOSIS — O09523 Supervision of elderly multigravida, third trimester: Secondary | ICD-10-CM

## 2024-01-21 DIAGNOSIS — O0990 Supervision of high risk pregnancy, unspecified, unspecified trimester: Secondary | ICD-10-CM

## 2024-01-21 NOTE — Patient Instructions (Signed)

## 2024-01-21 NOTE — Progress Notes (Signed)
   PRENATAL VISIT NOTE  Subjective:  Tammy Knapp is a 38 y.o. G3P2002 at [redacted]w[redacted]d being seen today for ongoing prenatal care.  She is currently monitored for the following issues for this high risk pregnancy and has Acne vulgaris; Decreased appetite; AMA (advanced maternal age) multigravida 35+; Supervision of high risk pregnancy, antepartum; Acute non-recurrent pansinusitis; and Fetal growth restriction antepartum on their problem list.  Patient reports nasal congestion, nasal drainage.   . Vag. Bleeding: None.  Movement: Present. Denies leaking of fluid.   The following portions of the patient's history were reviewed and updated as appropriate: allergies, current medications, past family history, past medical history, past social history, past surgical history and problem list.   Objective:    Vitals:   01/21/24 1629  BP: 111/74  Pulse: (!) 116  Temp: 99 F (37.2 C)  Weight: 176 lb 9.6 oz (80.1 kg)    Fetal Status:  Fetal Heart Rate (bpm): 146   Movement: Present    General: Alert, oriented and cooperative. Patient is in no acute distress.  Skin: Skin is warm and dry. No rash noted.   Cardiovascular: Normal heart rate noted  Respiratory: Normal respiratory effort, no problems with respiration noted  Abdomen: Soft, gravid, appropriate for gestational age.  Pain/Pressure: Absent     Pelvic: Cervical exam deferred        Extremities: Normal range of motion.  Edema: Trace  Mental Status: Normal mood and affect. Normal behavior. Normal judgment and thought content.   Assessment and Plan:  Pregnancy: G3P2002 at [redacted]w[redacted]d 1. Supervision of high risk pregnancy, antepartum (Primary) BP and FHR normal Doing well, feeling regular movement    2. Multigravida of advanced maternal age in second trimester Following MFM  3. Fetal growth restriction antepartum Previously 8%, 5/28 improved, EFW 23%, normal doppler, bpp 8/8 Follow up scheduled 6/28  4. [redacted] weeks gestation of  pregnancy Supportive measures and OTC tx discussed regarding nasal drainage   Preterm labor symptoms and general obstetric precautions including but not limited to vaginal bleeding, contractions, leaking of fluid and fetal movement were reviewed in detail with the patient. Please refer to After Visit Summary for other counseling recommendations.   Return in about 2 weeks (around 02/04/2024) for OB VISIT (MD or APP).  Future Appointments  Date Time Provider Department Center  02/04/2024  9:35 AM Halina Lever , CNM [facility]  02/14/2024  9:00 AM [facility] PROVIDER 1 [facility]  02/14/2024  9:30 AM [facility] US3 [facility]    Susi Eric, FNP

## 2024-01-31 ENCOUNTER — Ambulatory Visit

## 2024-02-04 ENCOUNTER — Ambulatory Visit: Admitting: Advanced Practice Midwife

## 2024-02-04 ENCOUNTER — Other Ambulatory Visit: Payer: Self-pay

## 2024-02-04 VITALS — BP 114/73 | HR 107 | Wt 180.4 lb

## 2024-02-04 DIAGNOSIS — O0990 Supervision of high risk pregnancy, unspecified, unspecified trimester: Secondary | ICD-10-CM

## 2024-02-04 DIAGNOSIS — O365990 Maternal care for other known or suspected poor fetal growth, unspecified trimester, not applicable or unspecified: Secondary | ICD-10-CM

## 2024-02-04 DIAGNOSIS — O09523 Supervision of elderly multigravida, third trimester: Secondary | ICD-10-CM | POA: Diagnosis not present

## 2024-02-04 DIAGNOSIS — Z3A32 32 weeks gestation of pregnancy: Secondary | ICD-10-CM

## 2024-02-04 NOTE — Progress Notes (Signed)
   PRENATAL VISIT NOTE  Subjective:  Tammy Knapp is a 39 y.o. G3P2002 at [redacted]w[redacted]d being seen today for ongoing prenatal care.  She is currently monitored for the following issues for this low-risk pregnancy and has Acne vulgaris; Decreased appetite; AMA (advanced maternal age) multigravida 35+; Supervision of high risk pregnancy, antepartum; Acute non-recurrent pansinusitis; and Fetal growth restriction antepartum on their problem list.   Patient reports upper back pain .  Contractions: Not present. Vag. Bleeding: None.  Movement: Present. Denies leaking of fluid.   The following portions of the patient's history were reviewed and updated as appropriate: allergies, current medications, past family history, past medical history, past social history, past surgical history and problem list.   Objective:   Vitals:   02/04/24 0918  BP: 114/73  Pulse: (!) 107  Weight: 180 lb 6.4 oz (81.8 kg)    Fetal Status: Fetal Heart Rate (bpm): 146   Movement: Present     General:  Alert, oriented and cooperative. Patient is in no acute distress.  Skin: Skin is warm and dry. No rash noted.   Cardiovascular: Normal heart rate noted  Respiratory: Normal respiratory effort, no problems with respiration noted  Abdomen: Soft, gravid, appropriate for gestational age.  Pain/Pressure: Present (upper back pain)     Pelvic: Cervical exam deferred        Extremities: Normal range of motion.  Edema: Trace  Mental Status: Normal mood and affect. Normal behavior. Normal judgment and thought content.   5/28:  Est. FW: 1391 gm 3 lb 1 oz 23 %  Assessment and Plan:  Pregnancy: G3P2002 at [redacted]w[redacted]d 1. Supervision of high risk pregnancy, antepartum (Primary)  2. Fetal growth restriction antepartum - Resolved. F/U scheduled  3. Multigravida of advanced maternal age in third trimester  4. [redacted] weeks gestation of pregnancy  Preterm labor symptoms and general obstetric precautions including but not limited to vaginal  bleeding, contractions, leaking of fluid and fetal movement were reviewed in detail with the patient. Please refer to After Visit Summary for other counseling recommendations.   No follow-ups on file.  Future Appointments  Date Time Provider Department Center  02/14/2024  9:00 AM [facility] PROVIDER 1 [facility]  02/14/2024  9:30 AM [facility] US3 [facility]    Caitlyne Ingham  Benard Brackett [facility] for [employer]

## 2024-02-12 ENCOUNTER — Other Ambulatory Visit: Payer: Self-pay

## 2024-02-12 DIAGNOSIS — O365990 Maternal care for other known or suspected poor fetal growth, unspecified trimester, not applicable or unspecified: Secondary | ICD-10-CM

## 2024-02-12 DIAGNOSIS — O09523 Supervision of elderly multigravida, third trimester: Secondary | ICD-10-CM

## 2024-02-14 ENCOUNTER — Ambulatory Visit

## 2024-02-14 ENCOUNTER — Ambulatory Visit: Attending: Obstetrics and Gynecology | Admitting: Maternal & Fetal Medicine

## 2024-02-14 DIAGNOSIS — O09523 Supervision of elderly multigravida, third trimester: Secondary | ICD-10-CM

## 2024-02-14 DIAGNOSIS — O365990 Maternal care for other known or suspected poor fetal growth, unspecified trimester, not applicable or unspecified: Secondary | ICD-10-CM

## 2024-02-14 DIAGNOSIS — Z3689 Encounter for other specified antenatal screening: Secondary | ICD-10-CM

## 2024-02-14 DIAGNOSIS — O0990 Supervision of high risk pregnancy, unspecified, unspecified trimester: Secondary | ICD-10-CM | POA: Diagnosis not present

## 2024-02-14 DIAGNOSIS — Z3A34 34 weeks gestation of pregnancy: Secondary | ICD-10-CM | POA: Diagnosis not present

## 2024-02-14 DIAGNOSIS — O365921 Maternal care for other known or suspected poor fetal growth, second trimester, fetus 1: Secondary | ICD-10-CM | POA: Diagnosis present

## 2024-02-18 ENCOUNTER — Ambulatory Visit: Admitting: Obstetrics & Gynecology

## 2024-02-18 ENCOUNTER — Other Ambulatory Visit: Payer: Self-pay

## 2024-02-18 VITALS — BP 109/76 | HR 98 | Wt 185.0 lb

## 2024-02-18 DIAGNOSIS — Z3A34 34 weeks gestation of pregnancy: Secondary | ICD-10-CM

## 2024-02-18 DIAGNOSIS — O0990 Supervision of high risk pregnancy, unspecified, unspecified trimester: Secondary | ICD-10-CM

## 2024-02-18 DIAGNOSIS — O09523 Supervision of elderly multigravida, third trimester: Secondary | ICD-10-CM

## 2024-02-18 NOTE — Progress Notes (Unsigned)
   PRENATAL VISIT NOTE  Subjective:  Tammy Knapp is a 38 y.o. G3P2002 at [redacted]w[redacted]d being seen today for ongoing prenatal care.  She is currently monitored for the following issues for this high-risk pregnancy and has Acne vulgaris; Decreased appetite; AMA (advanced maternal age) multigravida 35+; Supervision of high risk pregnancy, antepartum; Acute non-recurrent pansinusitis; and Fetal growth restriction antepartum on their problem list.  Patient reports no complaints.  Contractions: Not present. Vag. Bleeding: None.  Movement: Present. Denies leaking of fluid.   The following portions of the patient's history were reviewed and updated as appropriate: allergies, current medications, past family history, past medical history, past social history, past surgical history and problem list.   Objective:    Vitals:   02/18/24 0820  BP: 109/76  Pulse: 98  Weight: 185 lb (83.9 kg)    Fetal Status:  Fetal Heart Rate (bpm): 142   Movement: Present    General: Alert, oriented and cooperative. Patient is in no acute distress.  Skin: Skin is warm and dry. No rash noted.   Cardiovascular: Normal heart rate noted  Respiratory: Normal respiratory effort, no problems with respiration noted  Abdomen: Soft, gravid, appropriate for gestational age.  Pain/Pressure: Absent     Pelvic: Cervical exam deferred        Extremities: Normal range of motion.  Edema: Trace  Mental Status: Normal mood and affect. Normal behavior. Normal judgment and thought content.   Assessment and Plan:  Pregnancy: G3P2002 at [redacted]w[redacted]d 1. Multigravida of advanced maternal age in third trimester (Primary) Normal growth by US   2. Supervision of high risk pregnancy, antepartum   Preterm labor symptoms and general obstetric precautions including but not limited to vaginal bleeding, contractions, leaking of fluid and fetal movement were reviewed in detail with the patient. Please refer to After Visit Summary for other counseling  recommendations.   No follow-ups on file.  Future Appointments  Date Time Provider Department Center  03/03/2024  8:55 AM Eldonna Suzen Octave, MD [facility]  03/12/2024  1:55 PM Anyanwu, Gloris LABOR, MD [facility]  03/19/2024  3:15 PM Eldonna Suzen Octave, MD [facility]    Lynwood Solomons, MD

## 2024-02-25 NOTE — Progress Notes (Signed)
 After review, MFM consult with provider is not indicated for today  Tammy Nathanel Pipe, MD 02/25/2024 11:16 AM  [facility]

## 2024-03-03 ENCOUNTER — Other Ambulatory Visit: Payer: Self-pay

## 2024-03-03 ENCOUNTER — Ambulatory Visit (INDEPENDENT_AMBULATORY_CARE_PROVIDER_SITE_OTHER): Admitting: Family Medicine

## 2024-03-03 VITALS — BP 103/70 | HR 98 | Wt 188.6 lb

## 2024-03-03 DIAGNOSIS — O0990 Supervision of high risk pregnancy, unspecified, unspecified trimester: Secondary | ICD-10-CM

## 2024-03-03 DIAGNOSIS — Z3A36 36 weeks gestation of pregnancy: Secondary | ICD-10-CM

## 2024-03-03 DIAGNOSIS — O365930 Maternal care for other known or suspected poor fetal growth, third trimester, not applicable or unspecified: Secondary | ICD-10-CM

## 2024-03-03 DIAGNOSIS — O99013 Anemia complicating pregnancy, third trimester: Secondary | ICD-10-CM | POA: Diagnosis not present

## 2024-03-03 DIAGNOSIS — O09523 Supervision of elderly multigravida, third trimester: Secondary | ICD-10-CM | POA: Diagnosis not present

## 2024-03-03 DIAGNOSIS — D649 Anemia, unspecified: Secondary | ICD-10-CM | POA: Diagnosis not present

## 2024-03-03 DIAGNOSIS — O26893 Other specified pregnancy related conditions, third trimester: Secondary | ICD-10-CM | POA: Diagnosis not present

## 2024-03-03 DIAGNOSIS — O365990 Maternal care for other known or suspected poor fetal growth, unspecified trimester, not applicable or unspecified: Secondary | ICD-10-CM

## 2024-03-03 LAB — POCT HEMOGLOBIN-HEMACUE: Hemoglobin: 10.7 g/dL — ABNORMAL LOW (ref 12.0–15.0)

## 2024-03-03 NOTE — Progress Notes (Signed)
   PRENATAL VISIT NOTE  Subjective:  Tammy Knapp is a 38 y.o. G3P2002 at [redacted]w[redacted]d being seen today for ongoing prenatal care.  She is currently monitored for the following issues for this low-risk pregnancy and has Acne vulgaris; Decreased appetite; AMA (advanced maternal age) multigravida 35+; Supervision of high risk pregnancy, antepartum; Acute non-recurrent pansinusitis; Fetal growth restriction antepartum; and Anemia affecting pregnancy in third trimester on their problem list.  Patient reports no complaints.  Contractions: Not present. Vag. Bleeding: None.  Movement: Present. Denies leaking of fluid.   The following portions of the patient's history were reviewed and updated as appropriate: allergies, current medications, past family history, past medical history, past social history, past surgical history and problem list.   Objective:    Vitals:   03/03/24 0903  BP: 103/70  Pulse: 98  Weight: 188 lb 9 oz (85.5 kg)    Fetal Status:  Fetal Heart Rate (bpm): 140   Movement: Present    General: Alert, oriented and cooperative. Patient is in no acute distress.  Skin: Skin is warm and dry. No rash noted.   Cardiovascular: Normal heart rate noted  Respiratory: Normal respiratory effort, no problems with respiration noted  Abdomen: Soft, gravid, appropriate for gestational age.  Pain/Pressure: Absent     Pelvic: Cervical exam deferred        Extremities: Normal range of motion.  Edema: Trace  Mental Status: Normal mood and affect. Normal behavior. Normal judgment and thought content.   Assessment and Plan:  Pregnancy: G3P2002 at [redacted]w[redacted]d 1. Fetal growth restriction antepartum 2197g, 27th% on 02/14/24 No IOL given normal %  2. Supervision of high risk pregnancy, antepartum (Primary) Up to date FH appropriate Vigorous movement Concerns: extreme fatigue-- HGB 10.0 in May and on Fe. POC HGB was 10.7. Having back pain- upper back, reviewed stretches for upper back.   3. Multigravida  of advanced maternal age in third trimester  4. [redacted] weeks gestation of pregnancy - Culture, beta strep (group b only) - GC/Chlamydia probe amp ()not at [facility]  Preterm labor symptoms and general obstetric precautions including but not limited to vaginal bleeding, contractions, leaking of fluid and fetal movement were reviewed in detail with the patient. Please refer to After Visit Summary for other counseling recommendations.   No follow-ups on file.  Future Appointments  Date Time Provider Department Center  03/12/2024  1:55 PM Herchel Gloris LABOR, MD [facility]  03/19/2024  3:15 PM Eldonna Suzen Octave, MD [facility]  03/26/2024  3:15 PM Anyanwu, Gloris LABOR, MD [facility]    Suzen Octave Eldonna, MD

## 2024-03-12 ENCOUNTER — Ambulatory Visit: Admitting: Obstetrics & Gynecology

## 2024-03-12 ENCOUNTER — Encounter: Payer: Self-pay | Admitting: Obstetrics & Gynecology

## 2024-03-12 ENCOUNTER — Other Ambulatory Visit (HOSPITAL_COMMUNITY)
Admission: RE | Admit: 2024-03-12 | Discharge: 2024-03-12 | Disposition: A | Discharge status: Home / Self Care | Source: Ambulatory Visit | Attending: Obstetrics & Gynecology | Admitting: Obstetrics & Gynecology

## 2024-03-12 ENCOUNTER — Other Ambulatory Visit: Payer: Self-pay

## 2024-03-12 VITALS — BP 103/69 | HR 120 | Wt 194.1 lb

## 2024-03-12 DIAGNOSIS — O0990 Supervision of high risk pregnancy, unspecified, unspecified trimester: Secondary | ICD-10-CM | POA: Insufficient documentation

## 2024-03-12 DIAGNOSIS — Z3A37 37 weeks gestation of pregnancy: Secondary | ICD-10-CM | POA: Diagnosis not present

## 2024-03-12 DIAGNOSIS — O0993 Supervision of high risk pregnancy, unspecified, third trimester: Secondary | ICD-10-CM | POA: Diagnosis not present

## 2024-03-12 DIAGNOSIS — O09523 Supervision of elderly multigravida, third trimester: Secondary | ICD-10-CM | POA: Diagnosis not present

## 2024-03-12 NOTE — Patient Instructions (Signed)

## 2024-03-12 NOTE — Progress Notes (Signed)
   PRENATAL VISIT NOTE  Subjective:  Tammy Knapp is a 38 y.o. G3P2002 at [redacted]w[redacted]d being seen today for ongoing prenatal care.  She is currently monitored for the following issues for this low-risk pregnancy and has Acne vulgaris; AMA (advanced maternal age) multigravida 35+; Supervision of high risk pregnancy, antepartum; Acute non-recurrent pansinusitis; and Anemia affecting pregnancy in third trimester on their problem list.  Patient reports no complaints.  Contractions: Not present. Vag. Bleeding: None.  Movement: Present. Denies leaking of fluid.   The following portions of the patient's history were reviewed and updated as appropriate: allergies, current medications, past family history, past medical history, past social history, past surgical history and problem list.   Objective:    Vitals:   03/12/24 1356  BP: 103/69  Pulse: (!) 120  Weight: 194 lb 1.6 oz (88 kg)    Fetal Status:  Fetal Heart Rate (bpm): 141 Fundal Height: 37 cm Movement: Present Presentation: Vertex (Verified on bedside ultrasound)  General: Alert, oriented and cooperative. Patient is in no acute distress.  Skin: Skin is warm and dry. No rash noted.   Cardiovascular: Normal heart rate noted  Respiratory: Normal respiratory effort, no problems with respiration noted  Abdomen: Soft, gravid, appropriate for gestational age.  Pain/Pressure: Present     Pelvic: Cervical exam deferred but cultures obtained in the presence of RN as a chaperone        Extremities: Normal range of motion.  Edema: Trace  Mental Status: Normal mood and affect. Normal behavior. Normal judgment and thought content.   Assessment and Plan:  Pregnancy: G3P2002 at [redacted]w[redacted]d 1. Multigravida of advanced maternal age in third trimester 2. [redacted] weeks gestation of pregnancy 3. Supervision of high risk pregnancy, antepartum (Primary) Pelvic cultures done, will follow up results and manage accordingly. - Culture, beta strep (group b only) -  Cervicovaginal ancillary only( [place]) Labor symptoms and general obstetric precautions including but not limited to vaginal bleeding, contractions, leaking of fluid and fetal movement were reviewed in detail with the patient. Please refer to After Visit Summary for other counseling recommendations.   Return in about 1 week (around 03/19/2024) for OFFICE OB VISIT (MD or APP).  Future Appointments  Date Time Provider Department Center  03/19/2024  3:15 PM Eldonna Suzen Octave, MD [facility]  03/26/2024  3:15 PM Shalah Estelle, Gloris LABOR, MD [facility]    Gloris Hugger, MD

## 2024-03-13 ENCOUNTER — Ambulatory Visit: Payer: Self-pay | Admitting: Obstetrics & Gynecology

## 2024-03-13 DIAGNOSIS — O0990 Supervision of high risk pregnancy, unspecified, unspecified trimester: Secondary | ICD-10-CM

## 2024-03-13 LAB — CERVICOVAGINAL ANCILLARY ONLY
Chlamydia: NEGATIVE
Comment: NEGATIVE
Comment: NORMAL
Neisseria Gonorrhea: NEGATIVE

## 2024-03-17 ENCOUNTER — Encounter: Payer: Self-pay | Admitting: Obstetrics & Gynecology

## 2024-03-17 LAB — CULTURE, BETA STREP (GROUP B ONLY): Strep Gp B Culture: NEGATIVE

## 2024-03-17 NOTE — Progress Notes (Unsigned)
   PRENATAL VISIT NOTE  Subjective:  Tammy Knapp is a 38 y.o. G3P2002 at [redacted]w[redacted]d being seen today for ongoing prenatal care.  She is currently monitored for the following issues for this low-risk pregnancy and has AMA (advanced maternal age) multigravida 35+; Supervision of high risk pregnancy, antepartum; and Anemia affecting pregnancy in third trimester on their problem list.   Patient reports occasional contractions and tingling of fingers, worse at night. Similar Sx prior to pregnancy but worse not.  Contractions: Not present. Vag. Bleeding: None.  Movement: Present. Denies leaking of fluid.   The following portions of the patient's history were reviewed and updated as appropriate: allergies, current medications, past family history, past medical history, past social history, past surgical history and problem list.   Objective:   Vitals:   03/18/24 0857  BP: 112/77  Pulse: (!) 102  Weight: 196 lb 4.8 oz (89 kg)    Fetal Status: Fetal Heart Rate (bpm): 140 Fundal Height: 38 cm Movement: Present  Presentation: Vertex  General:  Alert, oriented and cooperative. Patient is in no acute distress.  Skin: Skin is warm and dry. No rash noted.   Cardiovascular: Normal heart rate noted  Respiratory: Normal respiratory effort, no problems with respiration noted  Abdomen: Soft, gravid, appropriate for gestational age.  Pain/Pressure: Absent     Pelvic: Cervical exam deferred        Extremities: Normal range of motion.  Edema: Trace  Mental Status: Normal mood and affect. Normal behavior. Normal judgment and thought content.   Assessment and Plan:  Pregnancy: G3P2002 at [redacted]w[redacted]d 1. Multigravida of advanced maternal age in third trimester (Primary)  2. Supervision of high risk pregnancy, antepartum  3. [redacted] weeks gestation of pregnancy - GBS neg 4. Carpal tunnel syndrome during pregnancy - Recommend wrist brace and elevation of hands during sleep, ice, comfort measures. Consider steroid  injections, F/U w/ ortho if no improvement w/in 6 weeks PP.   Term labor symptoms and general obstetric precautions including but not limited to vaginal bleeding, contractions, leaking of fluid and fetal movement were reviewed in detail with the patient. Please refer to After Visit Summary for other counseling recommendations.   No follow-ups on file.  Future Appointments  Date Time Provider Department Center  03/26/2024  3:15 PM Anyanwu, Gloris LABOR, MD [facility]    Rasheema Truluck  Claudene HOWARD [facility] for [employer]

## 2024-03-18 ENCOUNTER — Other Ambulatory Visit: Payer: Self-pay

## 2024-03-18 ENCOUNTER — Ambulatory Visit (INDEPENDENT_AMBULATORY_CARE_PROVIDER_SITE_OTHER): Admitting: Advanced Practice Midwife

## 2024-03-18 VITALS — BP 112/77 | HR 102 | Wt 196.3 lb

## 2024-03-18 DIAGNOSIS — O09523 Supervision of elderly multigravida, third trimester: Secondary | ICD-10-CM

## 2024-03-18 DIAGNOSIS — G5600 Carpal tunnel syndrome, unspecified upper limb: Secondary | ICD-10-CM

## 2024-03-18 DIAGNOSIS — O26899 Other specified pregnancy related conditions, unspecified trimester: Secondary | ICD-10-CM

## 2024-03-18 DIAGNOSIS — Z3A38 38 weeks gestation of pregnancy: Secondary | ICD-10-CM

## 2024-03-18 DIAGNOSIS — O0993 Supervision of high risk pregnancy, unspecified, third trimester: Secondary | ICD-10-CM | POA: Diagnosis not present

## 2024-03-18 DIAGNOSIS — O0990 Supervision of high risk pregnancy, unspecified, unspecified trimester: Secondary | ICD-10-CM

## 2024-03-18 DIAGNOSIS — O26893 Other specified pregnancy related conditions, third trimester: Secondary | ICD-10-CM

## 2024-03-18 NOTE — Patient Instructions (Addendum)
 [facility] for wrist brace.   Medical supply stores.

## 2024-03-19 ENCOUNTER — Encounter: Admitting: Family Medicine

## 2024-03-21 ENCOUNTER — Inpatient Hospital Stay (EMERGENCY_DEPARTMENT_HOSPITAL)
Admission: AD | Admit: 2024-03-21 | Discharge: 2024-03-21 | Disposition: A | Discharge status: Home / Self Care | Source: Home / Self Care | Attending: Obstetrics & Gynecology | Admitting: Obstetrics & Gynecology

## 2024-03-21 ENCOUNTER — Encounter (HOSPITAL_COMMUNITY): Payer: Self-pay | Admitting: Obstetrics & Gynecology

## 2024-03-21 ENCOUNTER — Other Ambulatory Visit: Payer: Self-pay

## 2024-03-21 DIAGNOSIS — O479 False labor, unspecified: Secondary | ICD-10-CM | POA: Diagnosis not present

## 2024-03-21 DIAGNOSIS — O471 False labor at or after 37 completed weeks of gestation: Secondary | ICD-10-CM | POA: Insufficient documentation

## 2024-03-21 DIAGNOSIS — Z3A39 39 weeks gestation of pregnancy: Secondary | ICD-10-CM

## 2024-03-21 DIAGNOSIS — O09523 Supervision of elderly multigravida, third trimester: Secondary | ICD-10-CM

## 2024-03-21 DIAGNOSIS — O0990 Supervision of high risk pregnancy, unspecified, unspecified trimester: Secondary | ICD-10-CM

## 2024-03-21 NOTE — Progress Notes (Signed)

## 2024-03-21 NOTE — MAU Provider Note (Signed)
 S: Ms. Tammy Knapp is a 38 y.o. G3P2002 at [redacted]w[redacted]d  who presents to MAU today complaining of irregular contractions described as cramping. Not feeling significant pain w contractions. She reports vaginal bleeding c/w bloody show. She denies LOF. She reports normal fetal movement.    O: BP 116/71 (BP Location: Right Arm)   Pulse (!) 117   Temp 98.1 F (36.7 C) (Oral)   Resp 18   Ht 5' 4 (1.626 m)   Wt 88.5 kg   LMP 06/21/2023   SpO2 97%   BMI 33.49 kg/m  RN labor eval  Cervical exam:  Dilation: 1 Effacement (%): 50 Cervical Position: Posterior Station: Ballotable Presentation: Undeterminable Exam by:: weston,rn   Fetal Monitoring: Baseline: 140 Variability: mod Accelerations: present Decelerations: absent Contractions: every 6-7   A/P: SIUP at [redacted]w[redacted]d likely here in very early labor vs Braxton Hicks contractions Not feeling discomfort w contractions NST reactive Discussed labor precautions, FKC Stable for d/c  Von Reasoner, MD 03/21/2024 11:35 AM'

## 2024-03-21 NOTE — MAU Note (Addendum)
 Tammy Knapp is a 38 y.o. at [redacted]w[redacted]d here in MAU reporting: she's having abdominal cramping that began last night, denies ctxs.  Reports had small amount VB mixed with mucous last night, but that has since stopped.  Denies LOF.  Endorses +FM.  LMP: 06/21/2023 Onset of complaint: last night Pain score: 1 Vitals:   03/21/24 1037  BP: 104/74  Pulse: (!) 112  Resp: 18  Temp: 98.1 F (36.7 C)  SpO2: 97%     FHT: 130 bpm  Lab orders placed from triage: None

## 2024-03-22 ENCOUNTER — Inpatient Hospital Stay (HOSPITAL_COMMUNITY): Admitting: Anesthesiology

## 2024-03-22 ENCOUNTER — Encounter (HOSPITAL_COMMUNITY): Payer: Self-pay | Admitting: Obstetrics and Gynecology

## 2024-03-22 ENCOUNTER — Inpatient Hospital Stay (HOSPITAL_COMMUNITY)
Admission: AD | Admit: 2024-03-22 | Discharge: 2024-03-24 | DRG: 807 | Disposition: A | Discharge status: Home / Self Care | Payer: Self-pay | Attending: Obstetrics & Gynecology | Admitting: Obstetrics & Gynecology

## 2024-03-22 DIAGNOSIS — O09523 Supervision of elderly multigravida, third trimester: Principal | ICD-10-CM

## 2024-03-22 DIAGNOSIS — D509 Iron deficiency anemia, unspecified: Secondary | ICD-10-CM | POA: Diagnosis present

## 2024-03-22 DIAGNOSIS — Z3A39 39 weeks gestation of pregnancy: Secondary | ICD-10-CM | POA: Diagnosis not present

## 2024-03-22 DIAGNOSIS — O0990 Supervision of high risk pregnancy, unspecified, unspecified trimester: Secondary | ICD-10-CM

## 2024-03-22 DIAGNOSIS — O26893 Other specified pregnancy related conditions, third trimester: Secondary | ICD-10-CM | POA: Diagnosis present

## 2024-03-22 DIAGNOSIS — O9902 Anemia complicating childbirth: Secondary | ICD-10-CM | POA: Diagnosis not present

## 2024-03-22 LAB — COMPREHENSIVE METABOLIC PANEL WITH GFR
ALT: 19 U/L (ref 0–44)
AST: 17 U/L (ref 15–41)
Albumin: 2.6 g/dL — ABNORMAL LOW (ref 3.5–5.0)
Alkaline Phosphatase: 107 U/L (ref 38–126)
Anion gap: 8 (ref 5–15)
BUN: 11 mg/dL (ref 6–20)
CO2: 20 mmol/L — ABNORMAL LOW (ref 22–32)
Calcium: 9.1 mg/dL (ref 8.9–10.3)
Chloride: 108 mmol/L (ref 98–111)
Creatinine, Ser: 0.6 mg/dL (ref 0.44–1.00)
GFR, Estimated: >60 mL/min (ref >60)
Glucose, Bld: 98 mg/dL (ref 70–99)
Potassium: 4.4 mmol/L (ref 3.5–5.1)
Sodium: 136 mmol/L (ref 135–145)
Total Bilirubin: 0.5 mg/dL (ref 0.0–1.2)
Total Protein: 6.6 g/dL (ref 6.5–8.1)

## 2024-03-22 LAB — CBC
HCT: 38.7 % (ref 36.0–46.0)
Hemoglobin: 12.4 g/dL (ref 12.0–15.0)
MCH: 31.7 pg (ref 26.0–34.0)
MCHC: 32 g/dL (ref 30.0–36.0)
MCV: 99 fL (ref 80.0–100.0)
Platelets: 210 K/uL (ref 150–400)
RBC: 3.91 MIL/uL (ref 3.87–5.11)
RDW: 14.5 % (ref 11.5–15.5)
WBC: 12.3 K/uL — ABNORMAL HIGH (ref 4.0–10.5)
nRBC: 0 % (ref 0.0–0.2)

## 2024-03-22 LAB — TYPE AND SCREEN
ABO/RH(D): O POS
Antibody Screen: NEGATIVE

## 2024-03-22 LAB — RPR: RPR Ser Ql: NONREACTIVE

## 2024-03-22 MED ORDER — ONDANSETRON HCL 4 MG/2ML IJ SOLN: 4.0000 mg | INTRAMUSCULAR | Status: DC | PRN

## 2024-03-22 MED ORDER — SODIUM CHLORIDE 0.9% FLUSH: 3.0000 mL | Freq: Two times a day (BID) | INTRAVENOUS | Status: DC

## 2024-03-22 MED ORDER — SOD CITRATE-CITRIC ACID 500-334 MG/5ML PO SOLN
30.0000 mL | ORAL | Status: DC | PRN
Start: 2024-03-22 — End: 2024-03-22

## 2024-03-22 MED ORDER — FENTANYL-BUPIVACAINE-NACL 0.5-0.125-0.9 MG/250ML-% EP SOLN
12.0000 mL/h | EPIDURAL | Status: DC | PRN
  Administered 2024-03-22: 12 mL/h via EPIDURAL
  Filled 2024-03-22: qty 250

## 2024-03-22 MED ORDER — LACTATED RINGERS IV SOLN
500.0000 mL | Freq: Once | INTRAVENOUS | Status: AC
  Administered 2024-03-22: 500 mL via INTRAVENOUS

## 2024-03-22 MED ORDER — LIDOCAINE HCL (PF) 1 % IJ SOLN
INTRAMUSCULAR | Status: DC | PRN
  Administered 2024-03-22 (×3): 5 mL via EPIDURAL

## 2024-03-22 MED ORDER — ACETAMINOPHEN 325 MG PO TABS: 650.0000 mg | ORAL_TABLET | ORAL | Status: DC | PRN

## 2024-03-22 MED ORDER — PRENATAL MULTIVITAMIN CH
1.0000 | ORAL_TABLET | Freq: Every day | ORAL | Status: DC
  Administered 2024-03-23 – 2024-03-24 (×2): 1 via ORAL
  Filled 2024-03-22 (×2): qty 1

## 2024-03-22 MED ORDER — ONDANSETRON HCL 4 MG PO TABS: 4.0000 mg | ORAL_TABLET | ORAL | Status: DC | PRN

## 2024-03-22 MED ORDER — DIBUCAINE (PERIANAL) 1 % EX OINT: 1.0000 | TOPICAL_OINTMENT | CUTANEOUS | Status: DC | PRN

## 2024-03-22 MED ORDER — LIDOCAINE HCL (PF) 1 % IJ SOLN
30.0000 mL | INTRAMUSCULAR | Status: AC | PRN
  Administered 2024-03-22: 30 mL via SUBCUTANEOUS
  Filled 2024-03-22: qty 30

## 2024-03-22 MED ORDER — ONDANSETRON HCL 4 MG/2ML IJ SOLN: 4.0000 mg | Freq: Four times a day (QID) | INTRAMUSCULAR | Status: DC | PRN

## 2024-03-22 MED ORDER — OXYTOCIN BOLUS FROM INFUSION
333.0000 mL | Freq: Once | INTRAVENOUS | Status: AC
  Administered 2024-03-22: 333 mL via INTRAVENOUS

## 2024-03-22 MED ORDER — PHENYLEPHRINE 80 MCG/ML (10ML) SYRINGE FOR IV PUSH (FOR BLOOD PRESSURE SUPPORT): 80.0000 ug | PREFILLED_SYRINGE | INTRAVENOUS | Status: DC | PRN

## 2024-03-22 MED ORDER — SIMETHICONE 80 MG PO CHEW: 80.0000 mg | CHEWABLE_TABLET | ORAL | Status: DC | PRN

## 2024-03-22 MED ORDER — DIPHENHYDRAMINE HCL 25 MG PO CAPS: 25.0000 mg | ORAL_CAPSULE | Freq: Four times a day (QID) | ORAL | Status: DC | PRN

## 2024-03-22 MED ORDER — DIPHENHYDRAMINE HCL 50 MG/ML IJ SOLN: 12.5000 mg | INTRAMUSCULAR | Status: DC | PRN

## 2024-03-22 MED ORDER — IBUPROFEN 600 MG PO TABS
600.0000 mg | ORAL_TABLET | Freq: Four times a day (QID) | ORAL | Status: DC
  Administered 2024-03-22 – 2024-03-24 (×8): 600 mg via ORAL
  Filled 2024-03-22 (×8): qty 1

## 2024-03-22 MED ORDER — SODIUM CHLORIDE 0.9% FLUSH: 3.0000 mL | INTRAVENOUS | Status: DC | PRN

## 2024-03-22 MED ORDER — OXYCODONE-ACETAMINOPHEN 5-325 MG PO TABS: 1.0000 | ORAL_TABLET | ORAL | Status: DC | PRN

## 2024-03-22 MED ORDER — WITCH HAZEL-GLYCERIN EX PADS
1.0000 | MEDICATED_PAD | CUTANEOUS | Status: DC | PRN
Start: 2024-03-22 — End: 2024-03-24

## 2024-03-22 MED ORDER — COCONUT OIL OIL: 1.0000 | TOPICAL_OIL | Status: DC | PRN

## 2024-03-22 MED ORDER — LACTATED RINGERS IV SOLN: 500.0000 mL | INTRAVENOUS | Status: DC | PRN

## 2024-03-22 MED ORDER — EPHEDRINE 5 MG/ML INJ: 10.0000 mg | INTRAVENOUS | Status: DC | PRN

## 2024-03-22 MED ORDER — OXYCODONE-ACETAMINOPHEN 5-325 MG PO TABS: 2.0000 | ORAL_TABLET | ORAL | Status: DC | PRN

## 2024-03-22 MED ORDER — ZOLPIDEM TARTRATE 5 MG PO TABS: 5.0000 mg | ORAL_TABLET | Freq: Every evening | ORAL | Status: DC | PRN

## 2024-03-22 MED ORDER — FENTANYL CITRATE (PF) 100 MCG/2ML IJ SOLN: 50.0000 ug | INTRAMUSCULAR | Status: DC | PRN

## 2024-03-22 MED ORDER — BENZOCAINE-MENTHOL 20-0.5 % EX AERO
1.0000 | INHALATION_SPRAY | CUTANEOUS | Status: DC | PRN
  Administered 2024-03-22: 1 via TOPICAL
  Filled 2024-03-22: qty 56

## 2024-03-22 MED ORDER — OXYTOCIN-SODIUM CHLORIDE 30-0.9 UT/500ML-% IV SOLN
2.5000 [IU]/h | INTRAVENOUS | Status: DC
  Administered 2024-03-22: 2.5 [IU]/h via INTRAVENOUS
  Filled 2024-03-22: qty 500

## 2024-03-22 MED ORDER — EPHEDRINE 5 MG/ML INJ
10.0000 mg | INTRAVENOUS | Status: AC | PRN
  Administered 2024-03-22 (×2): 10 mg via INTRAVENOUS
  Filled 2024-03-22: qty 5

## 2024-03-22 MED ORDER — LACTATED RINGERS IV SOLN: INTRAVENOUS | Status: DC

## 2024-03-22 MED ORDER — SODIUM CHLORIDE 0.9 % IV SOLN: 250.0000 mL | INTRAVENOUS | Status: DC | PRN

## 2024-03-22 MED ORDER — SENNOSIDES-DOCUSATE SODIUM 8.6-50 MG PO TABS
2.0000 | ORAL_TABLET | ORAL | Status: DC
  Administered 2024-03-22 – 2024-03-23 (×2): 2 via ORAL
  Filled 2024-03-22 (×2): qty 2

## 2024-03-22 NOTE — Lactation Note (Signed)
 This note was copied from a baby's chart. Lactation Consultation Note  Patient Name: Tammy Knapp Unijb'd Date: 03/22/2024 Age:39 hours Reason for consult: Initial assessment;Term  P3- MOB's feeding plan is to do both breast milk and formula. MOB reports that she has only bottle fed infant so far. Per MOB infant had just recently eaten formula so he is not hungry. Infant was alert and cueing, so LC encouraged latching him to the breast. MOB declined and stated that she will breastfeed him later. MOB reports breastfeeding her other children for only a couple of weeks. MOB could give LC any more information on her breastfeeding experience with her older children. MOB denies having any questions, concerns or needing a pump.   LC reviewed the first 24 hr birthday nap, day 2 cluster feeding, feeding infant on cue 8-12x in 24 hrs, not allowing infant to go over 3 hrs without a feeding, CDC milk storage guidelines, LC services handout and engorgement/breast care. LC encouraged MOB to call for further assistance as needed.  Maternal Data Has patient been taught Hand Expression?: No Does the patient have breastfeeding experience prior to this delivery?: Yes How long did the patient breastfeed?: a couple of weeks per MOB  Feeding Mother's Current Feeding Choice: Breast Milk and Formula Nipple Type: Slow - flow  Lactation Tools Discussed/Used Pump Education: Milk Storage  Interventions Interventions: Breast feeding basics reviewed;Education;LC Services brochure  Discharge Discharge Education: Engorgement and breast care;Warning signs for feeding baby Pump: Declined  Consult Status Consult Status: PRN Date: 03/22/24 Follow-up type: In-patient    Recardo Hoit BS, IBCLC 03/22/2024, 7:05 PM

## 2024-03-22 NOTE — Anesthesia Procedure Notes (Signed)
 Epidural Patient location during procedure: OB Start time: 03/22/2024 11:58 AM End time: 03/22/2024 12:07 PM  Staffing Anesthesiologist: Jerrye Sharper, MD Performed: anesthesiologist   Preanesthetic Checklist Completed: patient identified, IV checked, site marked, risks and benefits discussed, surgical consent, monitors and equipment checked, pre-op evaluation and timeout performed  Epidural Patient position: sitting Prep: DuraPrep and site prepped and draped Patient monitoring: continuous pulse ox and blood pressure Approach: midline Location: L3-L4 Injection technique: LOR air  Needle:  Needle type: Tuohy  Needle gauge: 17 G Needle length: 9 cm and 9 Needle insertion depth: 5 cm Catheter type: closed end flexible Catheter size: 19 Gauge Catheter at skin depth: 10 cm Test dose: negative and Other  Assessment Events: blood not aspirated, no cerebrospinal fluid, injection not painful, no injection resistance, no paresthesia and negative IV test  Additional Notes Patient identified. Risks and benefits discussed including failed block, incomplete  Pain control, post dural puncture headache, nerve damage, paralysis, blood pressure Changes, nausea, vomiting, reactions to medications-both toxic and allergic and post Partum back pain. All questions were answered. Patient expressed understanding and wished to proceed. Sterile technique was used throughout procedure. Epidural site was Dressed with sterile barrier dressing. No paresthesias, signs of intravascular injection Or signs of intrathecal spread were encountered.  Patient was more comfortable after the epidural was dosed. Please see RN's note for documentation of vital signs and FHR which are stable. Reason for block:procedure for pain

## 2024-03-22 NOTE — Anesthesia Procedure Notes (Signed)
 Epidural Patient location during procedure: OB Start time: 03/22/2024 3:50 AM End time: 03/22/2024 4:04 AM  Staffing Anesthesiologist: Jefm Garnette LABOR, MD Performed: anesthesiologist   Preanesthetic Checklist Completed: patient identified, IV checked, site marked, risks and benefits discussed, surgical consent, monitors and equipment checked, pre-op evaluation and timeout performed  Epidural Patient position: sitting Prep: DuraPrep and site prepped and draped Patient monitoring: continuous pulse ox and blood pressure Approach: midline Location: L3-L4 Injection technique: LOR air  Needle:  Needle type: Tuohy  Needle gauge: 17 G Needle length: 9 cm and 9 Needle insertion depth: 6 cm Catheter type: closed end flexible Catheter size: 19 Gauge Catheter at skin depth: 12 cm Test dose: negative  Assessment Events: blood not aspirated, no cerebrospinal fluid, injection not painful, no injection resistance, no paresthesia and negative IV test  Additional Notes Patient identified. Risks/Benefits/Options discussed with patient including but not limited to bleeding, infection, nerve damage, paralysis, failed block, incomplete pain control, headache, blood pressure changes, nausea, vomiting, reactions to medication both or allergic, itching and postpartum back pain. Confirmed with bedside nurse the patient's most recent platelet count. Confirmed with patient that they are not currently taking any anticoagulation, have any bleeding history or any family history of bleeding disorders. Patient expressed understanding and wished to proceed. All questions were answered. Sterile technique was used throughout the entire procedure. Please see nursing notes for vital signs. Test dose was given through epidural needle and negative prior to continuing to dose epidural or start infusion. Warning signs of high block given to the patient including shortness of breath, tingling/numbness in hands, complete motor  block, or any concerning symptoms with instructions to call for help. Patient was given instructions on fall risk and not to get out of bed. All questions and concerns addressed with instructions to call with any issues.  1 Attempt (S) . Patient tolerated procedure well.

## 2024-03-22 NOTE — MAU Note (Signed)
 Pt says she was here this am- UC's were 2. Has returned - Uc's are 5. Denies HSV GBS- neg

## 2024-03-22 NOTE — Discharge Summary (Signed)
 Postpartum Discharge Summary  Date of Service updated***     Patient Name: Tammy Knapp DOB: Feb 04, 1986 MRN: [id number]  Date of admission: 03/22/2024 Delivery date:03/22/2024 Delivering provider: DANNY BARABARA RAMAN Date of discharge: 03/22/2024  Admitting diagnosis: Normal labor [O80, Z37.9] Intrauterine pregnancy: [redacted]w[redacted]d     Secondary diagnosis:  Principal Problem:   Normal labor  Additional problems: ***    Discharge diagnosis: Term Pregnancy Delivered                                              Post partum procedures:{Postpartum procedures:23558} Augmentation: AROM Complications: None  Hospital course: Onset of Labor With Vaginal Delivery      38 y.o. yo G3P2002 at [redacted]w[redacted]d was admitted in Latent Labor on 03/22/2024. Labor course was uncomplicated.  Membrane Rupture Time/Date: 11:24 AM,03/22/2024  Delivery Method:Vaginal, Spontaneous Operative Delivery:N/A Episiotomy: None Lacerations:  2nd degree;Perineal Patient had a postpartum course complicated by ***.  She is ambulating, tolerating a regular diet, passing flatus, and urinating well. Patient is discharged home in stable condition on 03/22/24.  Newborn Data: Birth date:03/22/2024 Birth time:1:36 PM Gender:Female Living status:Living Apgars: ,9  Weight:   Magnesium Sulfate received: {Mag received:30440022} BMZ received: No Rhophylac:N/A MMR:No T-DaP:Given prenatally Flu: No RSV Vaccine received: No Transfusion:{Transfusion received:30440034}  Immunizations received: Immunization History  Administered Date(s) Administered   Influenza, Seasonal, Injecte, Preservative Fre 08/03/2015, 05/17/2023   Influenza,inj,Quad PF,6+ Mos 06/03/2018, 05/09/2019, 05/26/2020, 06/02/2021, 05/10/2022   PFIZER(Purple Top)SARS-COV-2 Vaccination 01/17/2020, 02/09/2020   Tdap 06/03/2018, 12/20/2023    Physical exam  Vitals:   03/22/24 1231 03/22/24 1301 03/22/24 1331 03/22/24 1345  BP: (!) 107/58 120/74 (!) 150/72 (!) 123/55  Pulse: (!)  101 100 (!) 114 (!) 113  Resp: 16 16 18 18   Temp:      TempSrc:      SpO2:      Weight:      Height:       General: {Exam; general:21111117} Lochia: {Desc; appropriate/inappropriate:30686::appropriate} Uterine Fundus: {Desc; firm/soft:30687} Incision: {Exam; incision:21111123} DVT Evaluation: {Exam; icu:7888877} Labs: Lab Results  Component Value Date   WBC 12.3 (H) 03/22/2024   HGB 12.4 03/22/2024   HCT 38.7 03/22/2024   MCV 99.0 03/22/2024   PLT 210 03/22/2024      Latest Ref Rng & Units 03/22/2024    2:53 AM  CMP  Glucose 70 - 99 mg/dL 98   BUN 6 - 20 mg/dL 11   Creatinine 9.55 - 1.00 mg/dL 9.39   Sodium 864 - 854 mmol/L 136   Potassium 3.5 - 5.1 mmol/L 4.4   Chloride 98 - 111 mmol/L 108   CO2 22 - 32 mmol/L 20   Calcium 8.9 - 10.3 mg/dL 9.1   Total Protein 6.5 - 8.1 g/dL 6.6   Total Bilirubin 0.0 - 1.2 mg/dL 0.5   Alkaline Phos 38 - 126 U/L 107   AST 15 - 41 U/L 17   ALT 0 - 44 U/L 19    Edinburgh Score:    10/14/2018   10:08 AM  Edinburgh Postnatal Depression Scale Screening Tool  I have been able to laugh and see the funny side of things. 0  I have looked forward with enjoyment to things. 0  I have blamed myself unnecessarily when things went wrong. 1  I have been anxious or worried for no good reason. 0  I have felt scared or panicky for no good reason. 0  Things have been getting on top of me. 2  I have been so unhappy that I have had difficulty sleeping. 2  I have felt sad or miserable. 0  I have been so unhappy that I have been crying. 0  The thought of harming myself has occurred to me. 0  Edinburgh Postnatal Depression Scale Total 5      Data saved with a previous flowsheet row definition   No data recorded  After visit meds:  Allergies as of 03/22/2024       Reactions   Beef-derived Drug Products    Chicken Meat (diagnostic)    Pork-derived Products      Med Rec must be completed prior to using this [facility]***        Discharge  home in stable condition Infant Feeding: {Baby feeding:23562} Infant Disposition:{CHL IP OB HOME WITH FNUYZM:76418} Discharge instruction: per After Visit Summary and Postpartum booklet. Activity: Advance as tolerated. Pelvic rest for 6 weeks.  Diet: {OB ipzu:78888878} Future Appointments: Future Appointments  Date Time Provider Department Center  03/26/2024  3:15 PM Anyanwu, Gloris LABOR, MD [facility]   Follow up Visit: Message to [facility] 8/2  Please schedule this patient for a In person postpartum visit in 4 weeks with the following provider: MD and Any provider. Additional Postpartum F/U:n/a  Low risk pregnancy complicated by: n/a Delivery mode:  Vaginal, Spontaneous Anticipated Birth Control:  none   03/22/2024 Augustin JAYSON Slade, MD

## 2024-03-22 NOTE — MAU Note (Signed)
 Tammy Knapp is a 38 y.o. at [redacted]w[redacted]d here in MAU reporting:   Pt states she was here in MAU earlier in the day and ctxs have become stronger. 5-9min apart. 5/10 pain lower abdominal. Pt reports blood show. +FM.   1cm CVE earlier in MAU.   Vitals:   03/22/24 0058 03/22/24 0101  BP: 110/68 110/68  Pulse: 100 96  Resp: 14   Temp: 97.7 F (36.5 C)   SpO2:  95%      FHT: 150 Lab orders placed from triage: Labor eval.

## 2024-03-22 NOTE — Anesthesia Preprocedure Evaluation (Signed)
 Anesthesia Evaluation   Patient awake    Reviewed: Allergy & Precautions, NPO status , Patient's Chart, lab work & pertinent test results  Airway Mallampati: II  TM Distance: >3 FB Neck ROM: Full    Dental no notable dental hx. (+) Teeth Intact, Dental Advisory Given   Pulmonary neg pulmonary ROS   Pulmonary exam normal breath sounds clear to auscultation       Cardiovascular negative cardio ROS Normal cardiovascular exam Rhythm:Regular Rate:Normal     Neuro/Psych negative neurological ROS  negative psych ROS   GI/Hepatic negative GI ROS, Neg liver ROS,,,  Endo/Other  negative endocrine ROS    Renal/GU negative Renal ROS     Musculoskeletal   Abdominal   Peds  Hematology Lab Results      Component                Value               Date                      WBC                      12.3 (H)            03/22/2024                HGB                      12.4                03/22/2024                HCT                      38.7                03/22/2024                MCV                      99.0                03/22/2024                PLT                      210                 03/22/2024              Anesthesia Other Findings   Reproductive/Obstetrics (+) Pregnancy                              Anesthesia Physical Anesthesia Plan  ASA: 2  Anesthesia Plan: Epidural   Post-op Pain Management:    Induction:   PONV Risk Score and Plan:   Airway Management Planned:   Additional Equipment:   Intra-op Plan:   Post-operative Plan:   Informed Consent: I have reviewed the patients History and Physical, chart, labs and discussed the procedure including the risks, benefits and alternatives for the proposed anesthesia with the patient or authorized representative who has indicated his/her understanding and acceptance.       Plan Discussed with:   Anesthesia Plan Comments:  (39.2 wk G3P2 for LEA)  Anesthesia Quick Evaluation

## 2024-03-22 NOTE — H&P (Signed)
 OBSTETRIC ADMISSION HISTORY AND PHYSICAL  Tammy Knapp is a 38 y.o. female G3P2002 with IUP at [redacted]w[redacted]d by LMP presenting for SOL. She reports +FMs, No LOF, no VB, no blurry vision, headaches or peripheral edema, and RUQ pain.  She plans on breast and formula feeding. She declines birth control. She received her prenatal care at [facility] transfer)  Dating: By LMP --->  Estimated Date of Delivery: 03/27/24  Sono:   @[redacted]w[redacted]d , CWD, normal female anatomy, breech presentation, anterior placental lie, 2197 gm 4 lb 13 oz 27 %  EFW   Prenatal History/Complications:  - AMA - Anemia Hb 10.7  Past Medical History: Past Medical History:  Diagnosis Date   Acne vulgaris 08/29/2011   Acute non-recurrent pansinusitis 12/02/2023   Anemia    No pertinent past medical history     Past Surgical History: Past Surgical History:  Procedure Laterality Date   NO PAST SURGERIES      Obstetrical History: OB History     Gravida  3   Para  2   Term  2   Preterm      AB      Living  2      SAB      IAB      Ectopic      Multiple  0   Live Births  2           Social History Social History   Socioeconomic History   Marital status: Married    Spouse name: Not on file   Number of children: Not on file   Years of education: Not on file   Highest education level: Not on file  Occupational History   Not on file  Tobacco Use   Smoking status: Never   Smokeless tobacco: Never  Vaping Use   Vaping status: Never Used  Substance and Sexual Activity   Alcohol use: Not Currently   Drug use: Never   Sexual activity: Yes    Birth control/protection: None  Other Topics Concern   Not on file  Social History Narrative   Not on file   Social Drivers of Health   Financial Resource Strain: Not on file  Food Insecurity: Not on file  Transportation Needs: Not on file  Physical Activity: Not on file  Stress: Not on file  Social Connections: Not on file    Family  History: Family History  Problem Relation Age of Onset   Healthy Mother     Allergies: Allergies  Allergen Reactions   Beef-Derived Drug Products    Chicken Meat (Diagnostic)    Pork-Derived Products     Medications Prior to Admission  Medication Sig Dispense Refill Last Dose/Taking   Prenatal Vit-Fe Fumarate-FA (MULTIVITAMIN-PRENATAL) 27-0.8 MG TABS tablet Take 1 tablet by mouth daily at 12 noon.   03/21/2024   Ferrous Sulfate (IRON PO) Take by mouth.   03/20/2024   ipratropium (ATROVENT ) 0.06 % nasal spray Place 2 sprays into both nostrils 4 (four) times daily. (Patient not taking: Reported on 03/12/2024) 15 mL 12      Review of Systems   All systems reviewed and negative except as stated in HPI  Blood pressure 110/68, pulse 96, temperature 97.7 F (36.5 C), resp. rate 14, height 5' 4 (1.626 m), weight 87.1 kg, last menstrual period 06/21/2023, SpO2 95%, unknown if currently breastfeeding. General appearance: alert, cooperative, appears stated age, and mild distress Lungs: clear to auscultation bilaterally Heart: regular rate and rhythm Abdomen: soft,  non-tender; bowel sounds normal Pelvic: adequate, proven to 3430g Extremities: Homans sign is negative, no sign of DVT DTR's 2+ Presentation: cephalic Fetal monitoringBaseline: 150 bpm, Variability: Good {> 6 bpm), Accelerations: Reactive, and Decelerations: Absent Uterine activityFrequency: Every 1-4 minutes Dilation: 5 Effacement (%): 60 Station: -2 Exam by:: Oleh Loges RN   Prenatal labs: ABO, Rh:   Antibody:   Rubella:   RPR: Non Reactive (05/01 0903)  HBsAg:    HIV: Non Reactive (05/01 0903)  GBS: Negative/-- (07/23 1700)    Lab Results  Component Value Date   GBS Negative 03/12/2024   GTT normal Genetic screening  declined Anatomy US  normal female  Immunization History  Administered Date(s) Administered   Influenza, Seasonal, Injecte, Preservative Fre 08/03/2015, 05/17/2023   Influenza,inj,Quad PF,6+  Mos 06/03/2018, 05/09/2019, 05/26/2020, 06/02/2021, 05/10/2022   PFIZER(Purple Top)SARS-COV-2 Vaccination 01/17/2020, 02/09/2020   Tdap 06/03/2018, 12/20/2023    Prenatal Transfer Tool  Maternal Diabetes: No Genetic Screening: Declined Maternal Ultrasounds/Referrals: Normal Fetal Ultrasounds or other Referrals:  None Maternal Substance Abuse:  No Significant Maternal Medications:  None Significant Maternal Lab Results: Group B Strep negative Number of Prenatal Visits:greater than 3 verified prenatal visits Maternal Vaccinations:TDap Other Comments:  None   No results found for this or any previous visit (from the past 24 hours).  Patient Active Problem List   Diagnosis Date Noted   Anemia affecting pregnancy in third trimester 03/03/2024   AMA (advanced maternal age) multigravida 35+ 11/22/2023   Supervision of high risk pregnancy, antepartum 11/22/2023    Assessment/Plan:  Tammy Knapp is a 38 y.o. G3P2002 at [redacted]w[redacted]d here for SOL  #Labor:Expectant management; AROM as desired. #Pain: Per pt request #FWB: Cat I #GBS status:  negative #Feeding: Breastmilk  and Formula #Reproductive Life planning: None #Circ:  yes  Tammy Shropshire, MD  03/22/2024, 2:16 AM

## 2024-03-23 LAB — CBC
HCT: 32.9 % — ABNORMAL LOW (ref 36.0–46.0)
Hemoglobin: 10.9 g/dL — ABNORMAL LOW (ref 12.0–15.0)
MCH: 32.3 pg (ref 26.0–34.0)
MCHC: 33.1 g/dL (ref 30.0–36.0)
MCV: 97.6 fL (ref 80.0–100.0)
Platelets: 185 K/uL (ref 150–400)
RBC: 3.37 MIL/uL — ABNORMAL LOW (ref 3.87–5.11)
RDW: 14.5 % (ref 11.5–15.5)
WBC: 15.1 K/uL — ABNORMAL HIGH (ref 4.0–10.5)
nRBC: 0 % (ref 0.0–0.2)

## 2024-03-23 MED ORDER — FERROUS FUMARATE 324 (106 FE) MG PO TABS
1.0000 | ORAL_TABLET | ORAL | Status: DC
  Administered 2024-03-23: 106 mg via ORAL
  Filled 2024-03-23: qty 1

## 2024-03-23 NOTE — Anesthesia Postprocedure Evaluation (Signed)
 Anesthesia Post Note  Patient: Tammy Knapp  Procedure(s) Performed: AN AD HOC LABOR EPIDURAL     Anesthesia Type: Epidural Anesthetic complications: no   No notable events documented.  Last Vitals:  Vitals:   03/23/24 0017 03/23/24 0538  BP: 109/68 97/61  Pulse: 88 84  Resp: 17 17  Temp: 36.7 C 36.8 C  SpO2:      Last Pain:  Vitals:   03/23/24 0925  TempSrc:   PainSc: 0-No pain   Pain Goal: Patients Stated Pain Goal: 0 (03/22/24 0736)                 Avelina Jenkins Pear

## 2024-03-23 NOTE — Progress Notes (Signed)
 POSTPARTUM PROGRESS NOTE  Post Partum Day 1  Subjective:  Tammy Knapp is a 38 y.o. H6E6996 s/p SVD at [redacted]w[redacted]d.  She reports she is doing well. No acute events overnight. She denies any problems with ambulating, voiding or po intake. Denies nausea or vomiting.  Pain is well controlled.  Lochia is similar to a period.  Objective: Blood pressure 97/61, pulse 84, temperature 98.2 F (36.8 C), temperature source Oral, resp. rate 17, height 5' 4 (1.626 m), weight 87.1 kg, last menstrual period 06/21/2023, SpO2 97%, unknown if currently breastfeeding.  Physical Exam:  General: alert, cooperative and no distress Chest: no respiratory distress Heart:regular rate, distal pulses intact Uterine Fundus: firm, appropriately tender DVT Evaluation: No calf swelling or tenderness Extremities: Trace pitting edema to mid-calf Skin: warm, dry  Recent Labs    03/22/24 0253 03/23/24 0507  HGB 12.4 10.9*  HCT 38.7 32.9*    Assessment/Plan: Keven Tipping is a 38 y.o. H6E6996 s/p SVD at [redacted]w[redacted]d   PPD#1 - Doing well  Routine postpartum care IDA Asymptomatic - Start PO iron  Contraception: Declines.  Feeding: Formula feeding Dispo: Plan for discharge tomorrow.   LOS: 1 day   Vernell DELENA School, MD FM Resident 03/23/2024, 9:55 AM

## 2024-03-23 NOTE — Lactation Note (Signed)
 This note was copied from a baby's chart. Lactation Consultation Note  Patient Name: Tammy Knapp Date: 03/23/2024 Age:39 hours Reason for consult: Follow-up assessment;Term  P3- MOB reports that infant has been feeding well on the formula, but doesn't seem interested when she places him to the breast. FOB expressed concern over infant acting hungry and irritable all day today. MOB/FOB report waiting 3 hrs for every feeding. LC reviewed cluster feeding and explained what that may look like. LC encouraged MOB to follow infant's hunger cues instead of scheduling feedings. Both FOB and MOB verbalized understanding. LC noted that infant was moving in his crib, so LC encouraged attempting a latch. MOB declined reporting that infant had just fed. LC verbalized understanding, reviewed cluster feeding again and encouraged MOB  to call out for infant's next feeding for latch assistance.  Maternal Data Has patient been taught Hand Expression?: No Does the patient have breastfeeding experience prior to this delivery?: Yes  Feeding Mother's Current Feeding Choice: Breast Milk and Formula Nipple Type: Slow - flow  Lactation Tools Discussed/Used Pump Education: Milk Storage  Interventions Interventions: Breast feeding basics reviewed;Education;LC Services brochure  Discharge Discharge Education: Engorgement and breast care;Warning signs for feeding baby Pump: Declined  Consult Status Consult Status: Follow-up Date: 03/24/24 Follow-up type: In-patient    Recardo Hoit BS, IBCLC 03/23/2024, 10:33 PM

## 2024-03-24 MED ORDER — IBUPROFEN 600 MG PO TABS: 600.0000 mg | ORAL_TABLET | Freq: Four times a day (QID) | ORAL | 0 refills | Status: DC

## 2024-03-24 MED ORDER — ACETAMINOPHEN 325 MG PO TABS
650.0000 mg | ORAL_TABLET | ORAL | 0 refills | Status: DC | PRN
Start: 2024-03-24 — End: 2024-05-08

## 2024-03-24 MED ORDER — SENNOSIDES-DOCUSATE SODIUM 8.6-50 MG PO TABS: 2.0000 | ORAL_TABLET | ORAL | 0 refills | Status: DC

## 2024-03-24 NOTE — Patient Instructions (Signed)
 If interested in an outpatient lactation consult in office or virtually please reach out to us  at [facility] (First Floor) [address], [place]  Please call [phone] and press 4 for lactation.    Lactation support groups:  [facility], Tuesdays 10:00 am -12:00 pm at [address] on the second floor in the conference room, lactating parents and lap babies welcome.  [email or web address]  [email or web address]   Geraldina Louder, [facility] Center for [facility]

## 2024-03-26 ENCOUNTER — Encounter: Admitting: Obstetrics & Gynecology

## 2024-03-31 ENCOUNTER — Ambulatory Visit: Payer: Self-pay

## 2024-03-31 NOTE — Telephone Encounter (Signed)
 FYI Only or Action Required?: FYI only for provider.  Patient was last seen in primary care on 11/30/2023 by Antonio Meth, Jamee SAUNDERS, DO.  Called Nurse Triage reporting Numbness.  Symptoms began x1 week before .  Interventions attempted: Nothing.  Symptoms are: gradually worsening.  Triage Disposition: No disposition on file.  Patient/caregiver understands and will follow disposition?: Yes     Copied from CRM [id number]. Topic: Clinical - Red Word Triage >> Mar 31, 2024  1:38 PM Jayma L wrote: Red Word that prompted transfer to Nurse Triage:   Patients husband was calling to schedule a physical . Patient just gave birth on 03/22/2024- since she's having numbness that's worsening in her right hand. Husbands name is Vernie Lash and he is on DPR list Answer Assessment - Initial Assessment Questions 1. SYMPTOM: What is the main symptom you are concerned about? (e.g., weakness, numbness)     numbness 2. ONSET: When did this start? (e.g., minutes, hours, days; while sleeping)     1 week before her baby was born 29. LAST NORMAL: When was the last time you (the patient) were normal (no symptoms)?     unknown 4. PATTERN Does this come and go, or has it been constant since it started?  Is it present now?     constant 5. CARDIAC SYMPTOMS: Have you had any of the following symptoms: chest pain, difficulty breathing, palpitations?     no 6. NEUROLOGIC SYMPTOMS: Have you had any of the following symptoms: headache, dizziness, vision loss, double vision, changes in speech, unsteady on your feet?     no 7. OTHER SYMPTOMS: Do you have any other symptoms?     no 8. PREGNANCY: Is there any chance you are pregnant? When was your last menstrual period?     na  Protocols used: Neurologic Deficit-A-AH

## 2024-04-02 ENCOUNTER — Ambulatory Visit: Admitting: Physician Assistant

## 2024-04-02 ENCOUNTER — Encounter: Payer: Self-pay | Admitting: Physician Assistant

## 2024-04-02 VITALS — BP 105/70 | HR 65 | Ht 64.0 in | Wt 174.0 lb

## 2024-04-02 DIAGNOSIS — R200 Anesthesia of skin: Secondary | ICD-10-CM | POA: Diagnosis not present

## 2024-04-02 MED ORDER — PREDNISONE 10 MG PO TABS: 10.0000 mg | ORAL_TABLET | Freq: Every day | ORAL | 0 refills | Status: AC

## 2024-04-02 NOTE — Progress Notes (Signed)
 Established patient visit   Patient: Tammy Knapp   DOB: 1985-11-15   38 y.o. Female  MRN: [id number] Visit Date: 04/02/2024  Today's healthcare provider: Manuelita Flatness, PA-C   Cc. Hand numbness  Subjective     Discussed the use of AI scribe software for clinical note transcription with the patient, who gave verbal consent to proceed.  History of Present Illness   Tammy Knapp is a 38 year old female who presents with persistent numbness and pain in both hands and arms postpartum.  The numbness and pain in both hands and arms began postpartum, initially affecting the right side and later progressing to the left. The right hand numbness is constant, and the pain involves the entire hand, including the thumb and fingers, interfering with tasks like holding a baby bottle. Tammy Knapp has experienced similar symptoms in the past, but they were less severe and intermittent.  Tammy Knapp is breastfeeding and supplementing with formula for her ten-day-old baby. Tammy Knapp feels tired and is not sleeping well, which Tammy Knapp attributes to caring for her newborn. Tammy Knapp has two other children, aged 56 and five      Medications: Outpatient Medications Prior to Visit  Medication Sig   acetaminophen  (TYLENOL ) 325 MG tablet Take 2 tablets (650 mg total) by mouth every 4 (four) hours as needed (for pain scale < 4).   Ferrous Sulfate (IRON PO) Take by mouth.   ibuprofen  (ADVIL ) 600 MG tablet Take 1 tablet (600 mg total) by mouth every 6 (six) hours.   ipratropium (ATROVENT ) 0.06 % nasal spray Place 2 sprays into both nostrils 4 (four) times daily. (Patient not taking: Reported on 03/12/2024)   Prenatal Vit-Fe Fumarate-FA (MULTIVITAMIN-PRENATAL) 27-0.8 MG TABS tablet Take 1 tablet by mouth daily at 12 noon.   senna-docusate (SENOKOT-S) 8.6-50 MG tablet Take 2 tablets by mouth daily.   No facility-administered medications prior to visit.    Review of Systems  Constitutional:  Negative for fatigue and fever.   Respiratory:  Negative for cough and shortness of breath.   Cardiovascular:  Negative for chest pain and leg swelling.  Gastrointestinal:  Negative for abdominal pain.  Neurological:  Positive for numbness. Negative for dizziness and headaches.       Objective    BP 105/70   Pulse 65   Ht 5' 4 (1.626 m)   Wt 174 lb (78.9 kg)   LMP 06/21/2023   Breastfeeding Yes   BMI 29.87 kg/m    Physical Exam Constitutional:      General: Tammy Knapp is awake.     Appearance: Tammy Knapp is well-developed.  HENT:     Head: Normocephalic.  Eyes:     Conjunctiva/sclera: Conjunctivae normal.  Cardiovascular:     Rate and Rhythm: Normal rate and regular rhythm.     Heart sounds: Normal heart sounds.  Pulmonary:     Effort: Pulmonary effort is normal.     Breath sounds: Normal breath sounds.  Musculoskeletal:     Comments: Radial and ulnar pulses intact bilaterally. Appropriate capillary refill bilaterally. Pt reports intact sensation bilaterally.  5/5 finger strength, 5/5 grip strength bilaterally  Skin:    General: Skin is warm.  Neurological:     Mental Status: Tammy Knapp is alert and oriented to person, place, and time.  Psychiatric:        Attention and Perception: Attention normal.        Mood and Affect: Mood normal.        Speech: Speech normal.  Behavior: Behavior is cooperative.      No results found for any visits on 04/02/24.  Assessment & Plan    Bilateral hand numbness -     predniSONE ; Take 1 tablet (10 mg total) by mouth daily with breakfast for 5 days.  Dispense: 5 tablet; Refill: 0   Likely carpal tunnel. Recommending bracing, especially at night. Reviewed types of wrist braces w/ patient Given severity of right hand numbness/pain, rx prednisone  10 mg x 5 days.  Offered referral to hand specialist for joint injection if symptoms persist.  Lactmed-- Amounts of prednisone  in breastmilk are very low. No adverse effect have been reported in breastfed infants with maternal use  of any corticosteroid during breastfeeding. Although it is often recommended to avoid breastfeeding for 4 hours after a dose this maneuver is not necessary because prednisone  milk levels are very low. Medium to large doses of corticosteroids given systemically or injected into joints or the breast have been reported to cause temporary reduction of lactation.  Return if symptoms worsen or fail to improve.       Manuelita Flatness, PA-C  [facility] at [facility] [phone] (phone) [phone] (fax)  [facility]

## 2024-04-05 ENCOUNTER — Telehealth (HOSPITAL_COMMUNITY): Payer: Self-pay | Admitting: *Deleted

## 2024-04-05 NOTE — Telephone Encounter (Signed)
 Attempted hospital discharge follow-up call. No answer received. No voicemail option.  Allean IVAR Carton, RN, 04/05/24, [phone]

## 2024-04-22 ENCOUNTER — Ambulatory Visit: Admitting: Obstetrics & Gynecology

## 2024-04-22 ENCOUNTER — Other Ambulatory Visit: Payer: Self-pay

## 2024-04-22 ENCOUNTER — Encounter: Payer: Self-pay | Admitting: Obstetrics & Gynecology

## 2024-04-22 NOTE — Progress Notes (Signed)
 Post Partum Visit Note  Tammy Knapp is a 38 y.o. G33P3003 female who presents for a postpartum visit. She is 6 weeks postpartum following a normal spontaneous vaginal delivery.  I have fully reviewed the prenatal and intrapartum course. The delivery was at 39 gestational weeks.  Anesthesia: epidural. Postpartum course has been good. Baby is doing well. Baby is feeding by bottle - Enfamil Neuropro. Bleeding staining only. Bowel function is constipation . Bladder function is normal. Patient is not sexually active. Contraception method is none. Postpartum depression screening: negative.   The pregnancy intention screening data noted above was reviewed. Potential methods of contraception were discussed. The patient elected to proceed with No data recorded.   Edinburgh Postnatal Depression Scale - 04/22/24 1425       Edinburgh Postnatal Depression Scale:  In the Past 7 Days   I have been able to laugh and see the funny side of things. 0    I have looked forward with enjoyment to things. 0    I have blamed myself unnecessarily when things went wrong. 0    I have been anxious or worried for no good reason. 0    I have felt scared or panicky for no good reason. 0    Things have been getting on top of me. 0    I have been so unhappy that I have had difficulty sleeping. 0    I have felt sad or miserable. 0    I have been so unhappy that I have been crying. 0    The thought of harming myself has occurred to me. 0    Edinburgh Postnatal Depression Scale Total 0          Health Maintenance Due  Topic Date Due   Hepatitis B Vaccines 19-59 Average Risk (1 of 3 - 19+ 3-dose series) Never done   HPV VACCINES (1 - 3-dose SCDM series) Never done   INFLUENZA VACCINE  03/21/2024   COVID-19 Vaccine (3 - 2025-26 season) 04/21/2024    The following portions of the patient's history were reviewed and updated as appropriate: allergies, current medications, past family history, past medical history,  past social history, past surgical history, and problem list.  Review of Systems Pertinent items are noted in HPI.  Objective:  BP 109/74 (BP Location: Right Arm, Patient Position: Sitting, Cuff Size: Normal)   Pulse 66   Wt 169 lb 9.6 oz (76.9 kg)   LMP 06/21/2023   SpO2 98%   BMI 29.11 kg/m    General:  alert, cooperative, and no distress   Breasts:  not indicated  Lungs:   Heart:    Abdomen: Not distended   Wound   GU exam:  not indicated       Assessment:    There are no diagnoses linked to this encounter.  normal postpartum exam.   Plan:   Essential components of care per ACOG recommendations:  1.  Mood and well being: Patient with negative depression screening today. Reviewed local resources for support.  - Patient tobacco use? No.   - hx of drug use? No.    2. Infant care and feeding:  -Patient currently breastmilk feeding? Yes. Discussed returning to work and pumping.  -Social determinants of health (SDOH) reviewed in EPIC. No concerns  3. Sexuality, contraception and birth spacing - Patient does not want a pregnancy in the next year.  Desired family size is 3 children.  - Reviewed reproductive life planning. Reviewed contraceptive methods  based on pt preferences and effectiveness.  Patient desired No Method - No Contraceptive Precautions today.   - Discussed birth spacing of 18 months  4. Sleep and fatigue -Encouraged family/partner/community support of 4 hrs of uninterrupted sleep to help with mood and fatigue  5. Physical Recovery  - Discussed patients delivery and complications. She describes her labor as good. - Patient had a Vaginal, no problems at delivery. Patient had a 2nd degree laceration. Perineal healing reviewed. Patient expressed understanding - Patient has urinary incontinence? No. - Patient is safe to resume physical and sexual activity  6.  Health Maintenance - HM due items addressed Yes - Last pap smear  Diagnosis  Date Value Ref  Range Status  06/29/2021   Final   - Negative for intraepithelial lesion or malignancy (NILM)   Pap smear not done at today's visit.  -Breast Cancer screening indicated? No.   7. Chronic Disease/Pregnancy Condition follow up: None  - PCP follow up  Lynwood Solomons, MD Center for [facility], [facility]

## 2024-05-07 ENCOUNTER — Ambulatory Visit: Payer: Self-pay

## 2024-05-07 NOTE — Telephone Encounter (Signed)
 FYI Only or Action Required?: FYI only for provider.  Patient was last seen in primary care on 04/02/2024 by Cyndi Shaver, PA-C.  Called Nurse Triage reporting Eye Pain.  Symptoms began several days ago.  Interventions attempted: Nothing.  Symptoms are: unchanged.  Triage Disposition: See Physician Within 24 Hours  Patient/caregiver understands and will follow disposition?: Yes    Copied from CRM #[id number]. Topic: Clinical - Red Word Triage >> May 07, 2024 11:03 AM Roselie BROCKS wrote: [facility] that prompted transfer to Nurse Triage: Patient left eye is swollen shut, really bad itching and extreme pain Reason for Disposition  Eye pain present > 24 hours  Answer Assessment - Initial Assessment Questions 1. ONSET: When did the pain start? (e.g., minutes, hours, days)     Left eye pain, swollen, itching 2. TIMING: Does the pain come and go, or has it been constant since it started? (e.g., constant, intermittent, fleeting)     4-5 days ago 3. SEVERITY: How bad is the pain?  (Scale 1-10; mild, moderate or severe)     mild 4. LOCATION: Where does it hurt?  (e.g., eyelid, eye, cheekbone)     Left eye 5. CAUSE: What do you think is causing the pain?     unknown 6. VISION: Do you have blurred vision or changes in your vision?      denies 7. EYE DISCHARGE: Is there any discharge (pus) from the eye(s)?  If Yes, ask: What color is it?      denies 8. FEVER: Do you have a fever? If Yes, ask: What is it, how was it measured, and when did it start?      denies 9. OTHER SYMPTOMS: Do you have any other symptoms? (e.g., headache, nasal discharge, facial rash)     denies 10. PREGNANCY: Is there any chance you are pregnant? When was your last menstrual period?       na  Protocols used: Eye Pain and Other Symptoms-A-AH

## 2024-05-07 NOTE — Progress Notes (Unsigned)
   Acute Office Visit  Subjective:     Patient ID: Tammy Knapp, female    DOB: 08-30-1985, 38 y.o.   MRN: [id number]  No chief complaint on file.   HPI Patient is in today for left eye pain.  Reports itchy swollen left eye started about 4 to 5 days ago reports the pain is mild.  Denies eye discharge.  Denies fevers.  Denies vision changes.  Patient denies fever, chills, SOB, CP, palpitations, dyspnea, edema, HA, vision changes, N/V/D, abdominal pain, urinary symptoms, rash, weight changes, and recent illness or hospitalizations.   ROS  See HPI      Objective:    There were no vitals taken for this visit. {Vitals History (Optional):23777}  Physical Exam  No results found for any visits on 05/08/24.      Assessment & Plan:   Problem List Items Addressed This Visit   None   No orders of the defined types were placed in this encounter.   No follow-ups on file.  Monnica Saltsman L Jp [place], NP

## 2024-05-08 ENCOUNTER — Ambulatory Visit: Admitting: Student

## 2024-05-08 ENCOUNTER — Encounter: Payer: Self-pay | Admitting: Student

## 2024-05-08 VITALS — BP 97/59 | HR 76 | Temp 98.1°F | Resp 16 | Ht 64.0 in | Wt 168.0 lb

## 2024-05-08 DIAGNOSIS — H00015 Hordeolum externum left lower eyelid: Secondary | ICD-10-CM | POA: Diagnosis not present

## 2024-08-02 NOTE — Patient Instructions (Signed)
It was great to see you again today!  

## 2024-08-02 NOTE — Progress Notes (Unsigned)
°  [facility] [address] [place], [place] [address] [phone]  Date:  08/04/2024   Name:  Tammy Knapp   DOB:  [birth year]   MRN:  [id number]  PCP:  Watt Harlene BROCKS, MD    Chief Complaint: No chief complaint on file.   History of Present Illness:  Tammy Knapp is a 38 y.o. very pleasant female patient who presents with the following:  Patient seen today for a physical exam.  She is generally in good health.  I saw her most recently 1 year ago; at that time she was pregnant with her third child and was due in August She delivered her third baby on August 2 of this year-she has follow-up with OB/GYN  - Flu shot Routine labs-can be updated -?  Pap smear through GYN  Discussed the use of AI scribe software for clinical note transcription with the patient, who gave verbal consent to proceed.  History of Present Illness    There are no active problems to display for this patient.   Past Medical History:  Diagnosis Date   Acne vulgaris 08/29/2011   Acute non-recurrent pansinusitis 12/02/2023   Anemia    No pertinent past medical history     Past Surgical History:  Procedure Laterality Date   NO PAST SURGERIES      Social History[1]  Family History  Problem Relation Age of Onset   Healthy Mother     Allergies[2]  Medication list has been reviewed and updated.  Medications Ordered Prior to Encounter[3]  Review of Systems:  As per HPI- otherwise negative.   Physical Examination: There were no vitals filed for this visit. There were no vitals filed for this visit. There is no height or weight on file to calculate BMI. Ideal Body Weight:    GEN: no acute distress. HEENT: Atraumatic, Normocephalic.  Ears and Nose: No external deformity. CV: RRR, No M/G/R. No JVD. No thrill. No extra heart sounds. PULM: CTA B, no wheezes, crackles, rhonchi. No retractions. No resp. distress. No  accessory muscle use. ABD: S, NT, ND, +BS. No rebound. No HSM. EXTR: No c/c/e PSYCH: Normally interactive. Conversant.    Assessment and Plan: No diagnosis found.  Assessment & Plan   Signed Harlene Watt, MD    [1]  Social History Tobacco Use   Smoking status: Never   Smokeless tobacco: Never  Vaping Use   Vaping status: Never Used  Substance Use Topics   Alcohol use: Not Currently   Drug use: Never  [2]  Allergies Allergen Reactions   Bovine (Beef) Protein-Containing Drug Products    Chicken Meat (Diagnostic)    Porcine (Pork) Protein-Containing Drug Products   [3]  No current outpatient medications on file prior to visit.   No current facility-administered medications on file prior to visit.

## 2024-08-04 ENCOUNTER — Encounter: Payer: Self-pay | Admitting: Family Medicine

## 2024-08-04 ENCOUNTER — Ambulatory Visit (INDEPENDENT_AMBULATORY_CARE_PROVIDER_SITE_OTHER): Admitting: Family Medicine

## 2024-08-04 VITALS — BP 102/68 | HR 86 | Temp 97.9°F | Ht 64.0 in | Wt 156.2 lb

## 2024-08-04 DIAGNOSIS — Z13220 Encounter for screening for lipoid disorders: Secondary | ICD-10-CM | POA: Diagnosis not present

## 2024-08-04 DIAGNOSIS — Z130 Encounter for screening for diseases of the blood and blood-forming organs and certain disorders involving the immune mechanism: Secondary | ICD-10-CM

## 2024-08-04 DIAGNOSIS — R350 Frequency of micturition: Secondary | ICD-10-CM | POA: Diagnosis not present

## 2024-08-04 DIAGNOSIS — Z0001 Encounter for general adult medical examination with abnormal findings: Secondary | ICD-10-CM | POA: Diagnosis not present

## 2024-08-04 DIAGNOSIS — Z131 Encounter for screening for diabetes mellitus: Secondary | ICD-10-CM

## 2024-08-04 DIAGNOSIS — Z1329 Encounter for screening for other suspected endocrine disorder: Secondary | ICD-10-CM

## 2024-08-04 DIAGNOSIS — Z0000 Encounter for general adult medical examination without abnormal findings: Secondary | ICD-10-CM

## 2024-08-04 LAB — POCT URINALYSIS DIP (MANUAL ENTRY)
Bilirubin, UA: NEGATIVE
Glucose, UA: NEGATIVE mg/dL
Ketones, POC UA: NEGATIVE mg/dL
Leukocytes, UA: NEGATIVE
Nitrite, UA: NEGATIVE
Protein Ur, POC: NEGATIVE mg/dL
Spec Grav, UA: 1.015 (ref 1.010–1.025)
Urobilinogen, UA: 0.2 U/dL
pH, UA: 5 (ref 5.0–8.0)

## 2024-08-05 ENCOUNTER — Encounter: Payer: Self-pay | Admitting: Family Medicine

## 2024-08-05 LAB — COMPREHENSIVE METABOLIC PANEL WITH GFR
ALT: 20 U/L (ref 0–35)
AST: 15 U/L (ref 5–37)
Albumin: 4.3 g/dL (ref 3.5–5.2)
Alkaline Phosphatase: 71 U/L (ref 39–117)
BUN: 15 mg/dL (ref 6–23)
CO2: 25 meq/L (ref 19–32)
Calcium: 9.6 mg/dL (ref 8.4–10.5)
Chloride: 106 meq/L (ref 96–112)
Creatinine, Ser: 0.68 mg/dL (ref 0.40–1.20)
GFR: 109.49 mL/min (ref >60.00)
Glucose, Bld: 85 mg/dL (ref 70–99)
Potassium: 3.9 meq/L (ref 3.5–5.1)
Sodium: 139 meq/L (ref 135–145)
Total Bilirubin: 0.3 mg/dL (ref 0.2–1.2)
Total Protein: 7.3 g/dL (ref 6.0–8.3)

## 2024-08-05 LAB — LIPID PANEL
Cholesterol: 186 mg/dL (ref 28–200)
HDL: 44.2 mg/dL (ref >39.00)
LDL Cholesterol: 119 mg/dL — ABNORMAL HIGH (ref 0–99)
NonHDL: 141.49
Total CHOL/HDL Ratio: 4
Triglycerides: 110 mg/dL (ref 0.0–149.0)
VLDL: 22 mg/dL (ref 0.0–40.0)

## 2024-08-05 LAB — URINE CULTURE
MICRO NUMBER:: 17356120
SPECIMEN QUALITY:: ADEQUATE

## 2024-08-05 LAB — HEMOGLOBIN A1C: Hgb A1c MFr Bld: 5 % (ref 4.6–6.5)

## 2024-08-05 LAB — CBC
HCT: 37.4 % (ref 36.0–46.0)
Hemoglobin: 12.7 g/dL (ref 12.0–15.0)
MCHC: 33.9 g/dL (ref 30.0–36.0)
MCV: 95.4 fl (ref 78.0–100.0)
Platelets: 231 K/uL (ref 150.0–400.0)
RBC: 3.92 Mil/uL (ref 3.87–5.11)
RDW: 12.6 % (ref 11.5–15.5)
WBC: 6.7 K/uL (ref 4.0–10.5)

## 2024-08-05 LAB — TSH: TSH: 1.35 u[IU]/mL (ref 0.35–5.50)

## 2024-08-06 ENCOUNTER — Encounter: Payer: Self-pay | Admitting: Family Medicine

## 2024-08-07 DIAGNOSIS — H40053 Ocular hypertension, bilateral: Secondary | ICD-10-CM | POA: Diagnosis not present
